# Patient Record
Sex: Female | Born: 1970 | Race: Black or African American | Hispanic: No | Marital: Married | Smoking: Never smoker
Health system: Southern US, Community
[De-identification: ages and names within clinical notes are randomized; demographics above are authoritative.]

## PROBLEM LIST (undated history)

## (undated) DIAGNOSIS — D219 Benign neoplasm of connective and other soft tissue, unspecified: Secondary | ICD-10-CM

## (undated) DIAGNOSIS — D649 Anemia, unspecified: Secondary | ICD-10-CM

---

## 2000-06-04 ENCOUNTER — Other Ambulatory Visit: Admission: RE | Admit: 2000-06-04 | Discharge: 2000-06-04 | Payer: Self-pay | Admitting: Obstetrics and Gynecology

## 2000-07-04 ENCOUNTER — Ambulatory Visit (HOSPITAL_COMMUNITY): Admission: RE | Admit: 2000-07-04 | Discharge: 2000-07-04 | Payer: Self-pay | Admitting: Obstetrics and Gynecology

## 2000-07-04 ENCOUNTER — Encounter: Payer: Self-pay | Admitting: Obstetrics and Gynecology

## 2000-07-12 ENCOUNTER — Ambulatory Visit (HOSPITAL_COMMUNITY): Admission: RE | Admit: 2000-07-12 | Discharge: 2000-07-12 | Payer: Self-pay | Admitting: Obstetrics and Gynecology

## 2000-07-28 ENCOUNTER — Inpatient Hospital Stay (HOSPITAL_COMMUNITY): Admission: AD | Admit: 2000-07-28 | Discharge: 2000-07-28 | Payer: Self-pay | Admitting: Obstetrics and Gynecology

## 2000-12-11 ENCOUNTER — Inpatient Hospital Stay (HOSPITAL_COMMUNITY): Admission: AD | Admit: 2000-12-11 | Discharge: 2000-12-13 | Payer: Self-pay | Admitting: Obstetrics and Gynecology

## 2001-06-26 HISTORY — PX: TUBAL LIGATION: SHX77

## 2001-11-19 ENCOUNTER — Other Ambulatory Visit: Admission: RE | Admit: 2001-11-19 | Discharge: 2001-11-19 | Payer: Self-pay | Admitting: Obstetrics and Gynecology

## 2006-07-17 ENCOUNTER — Ambulatory Visit: Payer: Self-pay

## 2012-01-01 ENCOUNTER — Emergency Department: Payer: Self-pay | Admitting: *Deleted

## 2013-06-26 DIAGNOSIS — B009 Herpesviral infection, unspecified: Secondary | ICD-10-CM | POA: Insufficient documentation

## 2013-09-22 ENCOUNTER — Other Ambulatory Visit: Payer: Self-pay | Admitting: Obstetrics and Gynecology

## 2013-09-22 DIAGNOSIS — D259 Leiomyoma of uterus, unspecified: Secondary | ICD-10-CM

## 2013-10-07 ENCOUNTER — Ambulatory Visit
Admission: RE | Admit: 2013-10-07 | Discharge: 2013-10-07 | Disposition: A | Discharge status: Home / Self Care | Payer: BC Managed Care – PPO | Source: Ambulatory Visit | Attending: Obstetrics and Gynecology | Admitting: Obstetrics and Gynecology

## 2013-10-07 DIAGNOSIS — D259 Leiomyoma of uterus, unspecified: Secondary | ICD-10-CM

## 2013-10-09 ENCOUNTER — Other Ambulatory Visit: Payer: Self-pay | Admitting: Obstetrics and Gynecology

## 2013-10-09 DIAGNOSIS — N920 Excessive and frequent menstruation with regular cycle: Secondary | ICD-10-CM

## 2013-10-09 DIAGNOSIS — D259 Leiomyoma of uterus, unspecified: Secondary | ICD-10-CM

## 2013-10-20 ENCOUNTER — Ambulatory Visit
Admission: RE | Admit: 2013-10-20 | Discharge: 2013-10-20 | Disposition: A | Discharge status: Home / Self Care | Payer: BC Managed Care – PPO | Source: Ambulatory Visit | Attending: Obstetrics and Gynecology | Admitting: Obstetrics and Gynecology

## 2013-10-20 DIAGNOSIS — N920 Excessive and frequent menstruation with regular cycle: Secondary | ICD-10-CM

## 2013-10-20 DIAGNOSIS — D259 Leiomyoma of uterus, unspecified: Secondary | ICD-10-CM

## 2013-10-20 MED ORDER — GADOBENATE DIMEGLUMINE 529 MG/ML IV SOLN
15.0000 mL | Freq: Once | INTRAVENOUS | Status: AC | PRN
  Administered 2013-10-20: 15 mL via INTRAVENOUS

## 2013-10-21 ENCOUNTER — Telehealth: Payer: Self-pay | Admitting: Emergency Medicine

## 2013-10-21 NOTE — Telephone Encounter (Signed)
Called pt to make her aware that Dr Barbie Banner has reviewed her MRI and we are good to proceed with the [place].  We will ck w/ ins. And will have Tiffany at [facility] to contact her to set up a date.  Pt is ready to proceed.

## 2013-11-04 ENCOUNTER — Other Ambulatory Visit (HOSPITAL_COMMUNITY): Payer: BC Managed Care – PPO

## 2013-11-28 ENCOUNTER — Encounter (HOSPITAL_COMMUNITY): Payer: Self-pay | Admitting: Pharmacy Technician

## 2013-11-28 ENCOUNTER — Other Ambulatory Visit: Payer: Self-pay | Admitting: Radiology

## 2013-12-02 ENCOUNTER — Observation Stay (HOSPITAL_COMMUNITY)
Admission: RE | Admit: 2013-12-02 | Discharge: 2013-12-03 | Disposition: A | Discharge status: Home / Self Care | Payer: BC Managed Care – PPO | Source: Ambulatory Visit | Attending: Interventional Radiology | Admitting: Interventional Radiology

## 2013-12-02 ENCOUNTER — Ambulatory Visit (HOSPITAL_COMMUNITY)
Admission: RE | Admit: 2013-12-02 | Discharge: 2013-12-02 | Disposition: A | Discharge status: Home / Self Care | Payer: BC Managed Care – PPO | Source: Ambulatory Visit | Attending: Interventional Radiology | Admitting: Interventional Radiology

## 2013-12-02 ENCOUNTER — Encounter (HOSPITAL_COMMUNITY): Payer: Self-pay

## 2013-12-02 VITALS — BP 131/85 | HR 72 | Temp 97.3°F | Resp 20 | Ht 69.0 in | Wt 176.1 lb

## 2013-12-02 DIAGNOSIS — D219 Benign neoplasm of connective and other soft tissue, unspecified: Secondary | ICD-10-CM | POA: Diagnosis present

## 2013-12-02 DIAGNOSIS — N920 Excessive and frequent menstruation with regular cycle: Secondary | ICD-10-CM | POA: Insufficient documentation

## 2013-12-02 DIAGNOSIS — D259 Leiomyoma of uterus, unspecified: Principal | ICD-10-CM | POA: Insufficient documentation

## 2013-12-02 HISTORY — DX: Anemia, unspecified: D64.9

## 2013-12-02 HISTORY — DX: Benign neoplasm of connective and other soft tissue, unspecified: D21.9

## 2013-12-02 LAB — PROTIME-INR
INR: 0.99 (ref 0.00–1.49)
Prothrombin Time: 12.9 seconds (ref 11.6–15.2)

## 2013-12-02 LAB — CBC WITH DIFFERENTIAL/PLATELET
BASOS PCT: 0 % (ref 0–1)
Basophils Absolute: 0 10*3/uL (ref 0.0–0.1)
EOS ABS: 0.2 10*3/uL (ref 0.0–0.7)
EOS PCT: 4 % (ref 0–5)
HEMATOCRIT: 26.4 % — AB (ref 36.0–46.0)
HEMOGLOBIN: 7.9 g/dL — AB (ref 12.0–15.0)
LYMPHS PCT: 39 % (ref 12–46)
Lymphs Abs: 2.1 10*3/uL (ref 0.7–4.0)
MCH: 22 pg — ABNORMAL LOW (ref 26.0–34.0)
MCHC: 29.9 g/dL — AB (ref 30.0–36.0)
MCV: 73.5 fL — ABNORMAL LOW (ref 78.0–100.0)
Monocytes Absolute: 0.4 10*3/uL (ref 0.1–1.0)
Monocytes Relative: 8 % (ref 3–12)
NEUTROS ABS: 2.7 10*3/uL (ref 1.7–7.7)
Neutrophils Relative %: 49 % (ref 43–77)
Platelets: 362 10*3/uL (ref 150–400)
RBC: 3.59 MIL/uL — AB (ref 3.87–5.11)
RDW: 16.7 % — ABNORMAL HIGH (ref 11.5–15.5)
WBC: 5.4 10*3/uL (ref 4.0–10.5)

## 2013-12-02 LAB — BASIC METABOLIC PANEL
BUN: 9 mg/dL (ref 6–23)
CHLORIDE: 104 meq/L (ref 96–112)
CO2: 26 mEq/L (ref 19–32)
Calcium: 9.3 mg/dL (ref 8.4–10.5)
Creatinine, Ser: 0.84 mg/dL (ref 0.50–1.10)
GFR calc non Af Amer: 85 mL/min — ABNORMAL LOW (ref >90)
GLUCOSE: 90 mg/dL (ref 70–99)
POTASSIUM: 4.1 meq/L (ref 3.7–5.3)
SODIUM: 139 meq/L (ref 137–147)

## 2013-12-02 LAB — HCG, SERUM, QUALITATIVE: PREG SERUM: NEGATIVE

## 2013-12-02 LAB — APTT: aPTT: 27 seconds (ref 24–37)

## 2013-12-02 MED ORDER — NITROGLYCERIN 1 MG/10 ML FOR IR/CATH LAB
100.0000 ug | INTRA_ARTERIAL | Status: AC
  Filled 2013-12-02: qty 10

## 2013-12-02 MED ORDER — ONDANSETRON HCL 4 MG/2ML IJ SOLN
4.0000 mg | Freq: Once | INTRAMUSCULAR | Status: AC
  Administered 2013-12-02: 4 mg via INTRAVENOUS
  Filled 2013-12-02: qty 2

## 2013-12-02 MED ORDER — PROMETHAZINE HCL 25 MG PO TABS: 25.0000 mg | ORAL_TABLET | Freq: Three times a day (TID) | ORAL | Status: DC | PRN

## 2013-12-02 MED ORDER — NITROGLYCERIN 1 MG/10 ML FOR IR/CATH LAB
100.0000 ug | INTRA_ARTERIAL | Status: AC
  Administered 2013-12-02: 100 ug via INTRA_ARTERIAL

## 2013-12-02 MED ORDER — HYDROMORPHONE HCL PF 1 MG/ML IJ SOLN
INTRAMUSCULAR | Status: AC | PRN
  Administered 2013-12-02 (×2): 1 mg via INTRAVENOUS

## 2013-12-02 MED ORDER — MIDAZOLAM HCL 2 MG/2ML IJ SOLN
INTRAMUSCULAR | Status: AC
  Filled 2013-12-02: qty 6

## 2013-12-02 MED ORDER — KETOROLAC TROMETHAMINE 30 MG/ML IJ SOLN
30.0000 mg | INTRAMUSCULAR | Status: AC
  Administered 2013-12-02: 30 mg via INTRAVENOUS
  Filled 2013-12-02: qty 1

## 2013-12-02 MED ORDER — MIDAZOLAM HCL 2 MG/2ML IJ SOLN
INTRAMUSCULAR | Status: AC | PRN
  Administered 2013-12-02: 2 mg via INTRAVENOUS
  Administered 2013-12-02 (×3): 1 mg via INTRAVENOUS

## 2013-12-02 MED ORDER — SODIUM CHLORIDE 0.9 % IJ SOLN
3.0000 mL | Freq: Two times a day (BID) | INTRAMUSCULAR | Status: DC
  Administered 2013-12-02: 3 mL via INTRAVENOUS

## 2013-12-02 MED ORDER — DOCUSATE SODIUM 100 MG PO CAPS
100.0000 mg | ORAL_CAPSULE | Freq: Two times a day (BID) | ORAL | Status: DC
  Administered 2013-12-02 – 2013-12-03 (×3): 100 mg via ORAL
  Filled 2013-12-02 (×4): qty 1

## 2013-12-02 MED ORDER — SODIUM CHLORIDE 0.9 % IV SOLN
INTRAVENOUS | Status: DC
  Administered 2013-12-02: 08:00:00 via INTRAVENOUS

## 2013-12-02 MED ORDER — SODIUM CHLORIDE 0.9 % IJ SOLN: 3.0000 mL | INTRAMUSCULAR | Status: DC | PRN

## 2013-12-02 MED ORDER — SODIUM CHLORIDE 0.9 % IJ SOLN: 9.0000 mL | INTRAMUSCULAR | Status: DC | PRN

## 2013-12-02 MED ORDER — DIPHENHYDRAMINE HCL 50 MG/ML IJ SOLN: 12.5000 mg | Freq: Four times a day (QID) | INTRAMUSCULAR | Status: DC | PRN

## 2013-12-02 MED ORDER — ONDANSETRON HCL 4 MG/2ML IJ SOLN: 4.0000 mg | Freq: Four times a day (QID) | INTRAMUSCULAR | Status: DC | PRN

## 2013-12-02 MED ORDER — HYDROMORPHONE 0.3 MG/ML IV SOLN
INTRAVENOUS | Status: DC
  Administered 2013-12-02: 0.6 mg via INTRAVENOUS
  Administered 2013-12-02: 12:00:00 via INTRAVENOUS
  Administered 2013-12-02: 2.4 mg via INTRAVENOUS
  Administered 2013-12-02: 2.78 mg via INTRAVENOUS
  Administered 2013-12-03: 04:00:00 via INTRAVENOUS
  Administered 2013-12-03: 1.41 mg via INTRAVENOUS
  Administered 2013-12-03: 1.9 mg via INTRAVENOUS
  Filled 2013-12-02: qty 25

## 2013-12-02 MED ORDER — ONDANSETRON HCL 4 MG/2ML IJ SOLN
4.0000 mg | Freq: Four times a day (QID) | INTRAMUSCULAR | Status: DC | PRN
  Administered 2013-12-03: 4 mg via INTRAVENOUS
  Filled 2013-12-02: qty 2

## 2013-12-02 MED ORDER — HYDROCODONE-ACETAMINOPHEN 5-325 MG PO TABS: 1.0000 | ORAL_TABLET | ORAL | Status: DC | PRN

## 2013-12-02 MED ORDER — DIPHENHYDRAMINE HCL 12.5 MG/5ML PO ELIX
12.5000 mg | ORAL_SOLUTION | Freq: Four times a day (QID) | ORAL | Status: DC | PRN
  Filled 2013-12-02: qty 5

## 2013-12-02 MED ORDER — PROMETHAZINE HCL 25 MG RE SUPP: 25.0000 mg | Freq: Three times a day (TID) | RECTAL | Status: DC | PRN

## 2013-12-02 MED ORDER — HYDROMORPHONE HCL PF 2 MG/ML IJ SOLN
INTRAMUSCULAR | Status: AC
  Filled 2013-12-02: qty 2

## 2013-12-02 MED ORDER — IBUPROFEN 800 MG PO TABS
800.0000 mg | ORAL_TABLET | Freq: Four times a day (QID) | ORAL | Status: DC
  Administered 2013-12-02 – 2013-12-03 (×4): 800 mg via ORAL
  Filled 2013-12-02 (×7): qty 1

## 2013-12-02 MED ORDER — NALOXONE HCL 0.4 MG/ML IJ SOLN: 0.4000 mg | INTRAMUSCULAR | Status: DC | PRN

## 2013-12-02 MED ORDER — LIDOCAINE HCL 1 % IJ SOLN
INTRAMUSCULAR | Status: AC
  Filled 2013-12-02: qty 20

## 2013-12-02 MED ORDER — CEFAZOLIN SODIUM-DEXTROSE 2-3 GM-% IV SOLR
2.0000 g | Freq: Once | INTRAVENOUS | Status: AC
  Administered 2013-12-02: 2 g via INTRAVENOUS
  Filled 2013-12-02: qty 50

## 2013-12-02 MED ORDER — SODIUM CHLORIDE 0.9 % IV SOLN: 250.0000 mL | INTRAVENOUS | Status: DC | PRN

## 2013-12-02 MED ORDER — HYDROMORPHONE 0.3 MG/ML IV SOLN
INTRAVENOUS | Status: AC
  Filled 2013-12-02: qty 25

## 2013-12-02 NOTE — Procedures (Signed)
B [place] No comp

## 2013-12-02 NOTE — Progress Notes (Signed)
Subjective: Pt seen post procedure. Resting in bed, sleepy from pain meds, but arouseable and answers questions appropriately. Report pain 'not too bad' No N/V  Objective: Physical Exam: BP 109/71  Pulse 70  Temp(Src) 97.8 F (36.6 C) (Oral)  Resp 9  Ht 5\' 9"  (1.753 m)  Wt 176 lb 2.4 oz (79.9 kg)  BMI 26.00 kg/m2  SpO2 98%  LMP 11/15/2013 Abd soft, ND, mildly tender (R)groin soft, NT, no hematoma    Labs: CBC  Recent Labs  12/02/13 0734  WBC 5.4  HGB 7.9*  HCT 26.4*  PLT 362   BMET  Recent Labs  12/02/13 0734  NA 139  K 4.1  CL 104  CO2 26  GLUCOSE 90  BUN 9  CREATININE 0.84  CALCIUM 9.3   LFT No results found for this basename: PROT, ALBUMIN, AST, ALT, ALKPHOS, BILITOT, BILIDIR, IBILI, LIPASE,  in the last 72 hours PT/INR  Recent Labs  12/02/13 0734  LABPROT 12.9  INR 0.99     Studies/Results: Ir Angiogram Pelvis Selective Or Supraselective  12/02/2013   CLINICAL DATA:  Uterine fibroids.  Menorrhagia.  EXAM: UTERINE ARTERY EMBOLIZATION  FLUOROSCOPY TIME:  34 min and 36 seconds.  MEDICATIONS AND MEDICAL HISTORY: Versed 5 mg, Fentanyl 300 mcg.  As antibiotic prophylaxis, Ancef was ordered pre-procedure and administered intravenously within one hour of incision.  Allergies: None  ANESTHESIA/SEDATION: Moderate sedation time: 1 hr and 50 min.  CONTRAST:  108 cc Omnipaque 300.  PROCEDURE: Vessels selected:  Third order bilateral iliac artery.  The procedure, risks, benefits, and alternatives were explained to the patient. Questions regarding the procedure were encouraged and answered. The patient understands and consents to the procedure.  The right groin was prepped with Betadine in a sterile fashion, and a sterile drape was applied covering the operative field. A sterile gown and sterile gloves were used for the procedure.  A micropuncture needle was inserted into the right common femoral artery, under sonographic guidance, followed by insertion of a 018 wire  which was exchanged for a [employer]. A 5-French sheath was inserted. Cobra II catheter was advanced into the aorta. The contralateral left common iliac artery was selected. The internal iliac artery on the left was selected. 3D angiography was performed. A micro catheter was advanced over an 018 glide wire into the left uterine artery. Angiography was performed.  Embolization was performed utilizingtwo vials 500-700 micron embospheres and 3.5 vials 700-900 micron embospheres.  The micro catheter was removed and flushed. A Waltman's loop was created. The ipsilateral right common iliac artery and right internal iliac artery were selected. Angiography was performed. A micro catheter was advanced over a 0018 glide wire into the right uterine artery. Angiography was performed.  Embolization was again performed with2 vials 500-700 micron embospheres and half of bile of the 709 100 micron embosphere.  The micro catheter and Cobra catheter were removed. Right femoral angiography was performed. An Exoseal device was deployed without complication and hemostasis was achieved.  FINDINGS: Imaging demonstrates bilateral pelvic anatomy and bilateral uterine artery angiography. Expected anatomy is identified. No blood supply from the uterine artery to the vagina is visualized.  Post embolization angiography demonstrates relative stasis of flow and pruning of the vasculature to the uterus.  COMPLICATIONS: None  IMPRESSION: Successful bilateral uterine artery embolization.   Electronically Signed   By: Maryclare Bean M.D.   On: 12/02/2013 12:56   Ir Angiogram Pelvis Selective Or Supraselective  12/02/2013   CLINICAL DATA:  Uterine  fibroids.  Menorrhagia.  EXAM: UTERINE ARTERY EMBOLIZATION  FLUOROSCOPY TIME:  34 min and 36 seconds.  MEDICATIONS AND MEDICAL HISTORY: Versed 5 mg, Fentanyl 300 mcg.  As antibiotic prophylaxis, Ancef was ordered pre-procedure and administered intravenously within one hour of incision.  Allergies: None   ANESTHESIA/SEDATION: Moderate sedation time: 1 hr and 50 min.  CONTRAST:  108 cc Omnipaque 300.  PROCEDURE: Vessels selected:  Third order bilateral iliac artery.  The procedure, risks, benefits, and alternatives were explained to the patient. Questions regarding the procedure were encouraged and answered. The patient understands and consents to the procedure.  The right groin was prepped with Betadine in a sterile fashion, and a sterile drape was applied covering the operative field. A sterile gown and sterile gloves were used for the procedure.  A micropuncture needle was inserted into the right common femoral artery, under sonographic guidance, followed by insertion of a 018 wire which was exchanged for a [employer]. A 5-French sheath was inserted. Cobra II catheter was advanced into the aorta. The contralateral left common iliac artery was selected. The internal iliac artery on the left was selected. 3D angiography was performed. A micro catheter was advanced over an 018 glide wire into the left uterine artery. Angiography was performed.  Embolization was performed utilizingtwo vials 500-700 micron embospheres and 3.5 vials 700-900 micron embospheres.  The micro catheter was removed and flushed. A Waltman's loop was created. The ipsilateral right common iliac artery and right internal iliac artery were selected. Angiography was performed. A micro catheter was advanced over a 0018 glide wire into the right uterine artery. Angiography was performed.  Embolization was again performed with2 vials 500-700 micron embospheres and half of bile of the 709 100 micron embosphere.  The micro catheter and Cobra catheter were removed. Right femoral angiography was performed. An Exoseal device was deployed without complication and hemostasis was achieved.  FINDINGS: Imaging demonstrates bilateral pelvic anatomy and bilateral uterine artery angiography. Expected anatomy is identified. No blood supply from the uterine artery to the  vagina is visualized.  Post embolization angiography demonstrates relative stasis of flow and pruning of the vasculature to the uterus.  COMPLICATIONS: None  IMPRESSION: Successful bilateral uterine artery embolization.   Electronically Signed   By: Maryclare Bean M.D.   On: 12/02/2013 12:56   Ir Angiogram Selective Each Additional Vessel  12/02/2013   CLINICAL DATA:  Uterine fibroids.  Menorrhagia.  EXAM: UTERINE ARTERY EMBOLIZATION  FLUOROSCOPY TIME:  34 min and 36 seconds.  MEDICATIONS AND MEDICAL HISTORY: Versed 5 mg, Fentanyl 300 mcg.  As antibiotic prophylaxis, Ancef was ordered pre-procedure and administered intravenously within one hour of incision.  Allergies: None  ANESTHESIA/SEDATION: Moderate sedation time: 1 hr and 50 min.  CONTRAST:  108 cc Omnipaque 300.  PROCEDURE: Vessels selected:  Third order bilateral iliac artery.  The procedure, risks, benefits, and alternatives were explained to the patient. Questions regarding the procedure were encouraged and answered. The patient understands and consents to the procedure.  The right groin was prepped with Betadine in a sterile fashion, and a sterile drape was applied covering the operative field. A sterile gown and sterile gloves were used for the procedure.  A micropuncture needle was inserted into the right common femoral artery, under sonographic guidance, followed by insertion of a 018 wire which was exchanged for a [employer]. A 5-French sheath was inserted. Cobra II catheter was advanced into the aorta. The contralateral left common iliac artery was selected. The internal iliac artery on  the left was selected. 3D angiography was performed. A micro catheter was advanced over an 018 glide wire into the left uterine artery. Angiography was performed.  Embolization was performed utilizingtwo vials 500-700 micron embospheres and 3.5 vials 700-900 micron embospheres.  The micro catheter was removed and flushed. A Waltman's loop was created. The ipsilateral right  common iliac artery and right internal iliac artery were selected. Angiography was performed. A micro catheter was advanced over a 0018 glide wire into the right uterine artery. Angiography was performed.  Embolization was again performed with2 vials 500-700 micron embospheres and half of bile of the 709 100 micron embosphere.  The micro catheter and Cobra catheter were removed. Right femoral angiography was performed. An Exoseal device was deployed without complication and hemostasis was achieved.  FINDINGS: Imaging demonstrates bilateral pelvic anatomy and bilateral uterine artery angiography. Expected anatomy is identified. No blood supply from the uterine artery to the vagina is visualized.  Post embolization angiography demonstrates relative stasis of flow and pruning of the vasculature to the uterus.  COMPLICATIONS: None  IMPRESSION: Successful bilateral uterine artery embolization.   Electronically Signed   By: Maryclare Bean M.D.   On: 12/02/2013 12:56   Ir Angiogram Selective Each Additional Vessel  12/02/2013   CLINICAL DATA:  Uterine fibroids.  Menorrhagia.  EXAM: UTERINE ARTERY EMBOLIZATION  FLUOROSCOPY TIME:  34 min and 36 seconds.  MEDICATIONS AND MEDICAL HISTORY: Versed 5 mg, Fentanyl 300 mcg.  As antibiotic prophylaxis, Ancef was ordered pre-procedure and administered intravenously within one hour of incision.  Allergies: None  ANESTHESIA/SEDATION: Moderate sedation time: 1 hr and 50 min.  CONTRAST:  108 cc Omnipaque 300.  PROCEDURE: Vessels selected:  Third order bilateral iliac artery.  The procedure, risks, benefits, and alternatives were explained to the patient. Questions regarding the procedure were encouraged and answered. The patient understands and consents to the procedure.  The right groin was prepped with Betadine in a sterile fashion, and a sterile drape was applied covering the operative field. A sterile gown and sterile gloves were used for the procedure.  A micropuncture needle was  inserted into the right common femoral artery, under sonographic guidance, followed by insertion of a 018 wire which was exchanged for a [employer]. A 5-French sheath was inserted. Cobra II catheter was advanced into the aorta. The contralateral left common iliac artery was selected. The internal iliac artery on the left was selected. 3D angiography was performed. A micro catheter was advanced over an 018 glide wire into the left uterine artery. Angiography was performed.  Embolization was performed utilizingtwo vials 500-700 micron embospheres and 3.5 vials 700-900 micron embospheres.  The micro catheter was removed and flushed. A Waltman's loop was created. The ipsilateral right common iliac artery and right internal iliac artery were selected. Angiography was performed. A micro catheter was advanced over a 0018 glide wire into the right uterine artery. Angiography was performed.  Embolization was again performed with2 vials 500-700 micron embospheres and half of bile of the 709 100 micron embosphere.  The micro catheter and Cobra catheter were removed. Right femoral angiography was performed. An Exoseal device was deployed without complication and hemostasis was achieved.  FINDINGS: Imaging demonstrates bilateral pelvic anatomy and bilateral uterine artery angiography. Expected anatomy is identified. No blood supply from the uterine artery to the vagina is visualized.  Post embolization angiography demonstrates relative stasis of flow and pruning of the vasculature to the uterus.  COMPLICATIONS: None  IMPRESSION: Successful bilateral uterine artery embolization.  Electronically Signed   By: Maryclare Bean M.D.   On: 12/02/2013 12:56   Ir Transcath/emboliz  12/02/2013   CLINICAL DATA:  Uterine fibroids.  Menorrhagia.  EXAM: UTERINE ARTERY EMBOLIZATION  FLUOROSCOPY TIME:  34 min and 36 seconds.  MEDICATIONS AND MEDICAL HISTORY: Versed 5 mg, Fentanyl 300 mcg.  As antibiotic prophylaxis, Ancef was ordered pre-procedure and  administered intravenously within one hour of incision.  Allergies: None  ANESTHESIA/SEDATION: Moderate sedation time: 1 hr and 50 min.  CONTRAST:  108 cc Omnipaque 300.  PROCEDURE: Vessels selected:  Third order bilateral iliac artery.  The procedure, risks, benefits, and alternatives were explained to the patient. Questions regarding the procedure were encouraged and answered. The patient understands and consents to the procedure.  The right groin was prepped with Betadine in a sterile fashion, and a sterile drape was applied covering the operative field. A sterile gown and sterile gloves were used for the procedure.  A micropuncture needle was inserted into the right common femoral artery, under sonographic guidance, followed by insertion of a 018 wire which was exchanged for a [employer]. A 5-French sheath was inserted. Cobra II catheter was advanced into the aorta. The contralateral left common iliac artery was selected. The internal iliac artery on the left was selected. 3D angiography was performed. A micro catheter was advanced over an 018 glide wire into the left uterine artery. Angiography was performed.  Embolization was performed utilizingtwo vials 500-700 micron embospheres and 3.5 vials 700-900 micron embospheres.  The micro catheter was removed and flushed. A Waltman's loop was created. The ipsilateral right common iliac artery and right internal iliac artery were selected. Angiography was performed. A micro catheter was advanced over a 0018 glide wire into the right uterine artery. Angiography was performed.  Embolization was again performed with2 vials 500-700 micron embospheres and half of bile of the 709 100 micron embosphere.  The micro catheter and Cobra catheter were removed. Right femoral angiography was performed. An Exoseal device was deployed without complication and hemostasis was achieved.  FINDINGS: Imaging demonstrates bilateral pelvic anatomy and bilateral uterine artery angiography.  Expected anatomy is identified. No blood supply from the uterine artery to the vagina is visualized.  Post embolization angiography demonstrates relative stasis of flow and pruning of the vasculature to the uterus.  COMPLICATIONS: None  IMPRESSION: Successful bilateral uterine artery embolization.   Electronically Signed   By: Maryclare Bean M.D.   On: 12/02/2013 12:56   Ir 3d Primitivo Gauze Darreld Mclean  12/02/2013   CLINICAL DATA:  Uterine fibroids.  Menorrhagia.  EXAM: UTERINE ARTERY EMBOLIZATION  FLUOROSCOPY TIME:  34 min and 36 seconds.  MEDICATIONS AND MEDICAL HISTORY: Versed 5 mg, Fentanyl 300 mcg.  As antibiotic prophylaxis, Ancef was ordered pre-procedure and administered intravenously within one hour of incision.  Allergies: None  ANESTHESIA/SEDATION: Moderate sedation time: 1 hr and 50 min.  CONTRAST:  108 cc Omnipaque 300.  PROCEDURE: Vessels selected:  Third order bilateral iliac artery.  The procedure, risks, benefits, and alternatives were explained to the patient. Questions regarding the procedure were encouraged and answered. The patient understands and consents to the procedure.  The right groin was prepped with Betadine in a sterile fashion, and a sterile drape was applied covering the operative field. A sterile gown and sterile gloves were used for the procedure.  A micropuncture needle was inserted into the right common femoral artery, under sonographic guidance, followed by insertion of a 018 wire which was exchanged for a [employer]. A 5-French  sheath was inserted. Cobra II catheter was advanced into the aorta. The contralateral left common iliac artery was selected. The internal iliac artery on the left was selected. 3D angiography was performed. A micro catheter was advanced over an 018 glide wire into the left uterine artery. Angiography was performed.  Embolization was performed utilizingtwo vials 500-700 micron embospheres and 3.5 vials 700-900 micron embospheres.  The micro catheter was removed and  flushed. A Waltman's loop was created. The ipsilateral right common iliac artery and right internal iliac artery were selected. Angiography was performed. A micro catheter was advanced over a 0018 glide wire into the right uterine artery. Angiography was performed.  Embolization was again performed with2 vials 500-700 micron embospheres and half of bile of the 709 100 micron embosphere.  The micro catheter and Cobra catheter were removed. Right femoral angiography was performed. An Exoseal device was deployed without complication and hemostasis was achieved.  FINDINGS: Imaging demonstrates bilateral pelvic anatomy and bilateral uterine artery angiography. Expected anatomy is identified. No blood supply from the uterine artery to the vagina is visualized.  Post embolization angiography demonstrates relative stasis of flow and pruning of the vasculature to the uterus.  COMPLICATIONS: None  IMPRESSION: Successful bilateral uterine artery embolization.   Electronically Signed   By: Maryclare Bean M.D.   On: 12/02/2013 12:56   Ir US Guide Vasc Access Right  12/02/2013   CLINICAL DATA:  Uterine fibroids.  Menorrhagia.  EXAM: UTERINE ARTERY EMBOLIZATION  FLUOROSCOPY TIME:  34 min and 36 seconds.  MEDICATIONS AND MEDICAL HISTORY: Versed 5 mg, Fentanyl 300 mcg.  As antibiotic prophylaxis, Ancef was ordered pre-procedure and administered intravenously within one hour of incision.  Allergies: None  ANESTHESIA/SEDATION: Moderate sedation time: 1 hr and 50 min.  CONTRAST:  108 cc Omnipaque 300.  PROCEDURE: Vessels selected:  Third order bilateral iliac artery.  The procedure, risks, benefits, and alternatives were explained to the patient. Questions regarding the procedure were encouraged and answered. The patient understands and consents to the procedure.  The right groin was prepped with Betadine in a sterile fashion, and a sterile drape was applied covering the operative field. A sterile gown and sterile gloves were used for the  procedure.  A micropuncture needle was inserted into the right common femoral artery, under sonographic guidance, followed by insertion of a 018 wire which was exchanged for a [employer]. A 5-French sheath was inserted. Cobra II catheter was advanced into the aorta. The contralateral left common iliac artery was selected. The internal iliac artery on the left was selected. 3D angiography was performed. A micro catheter was advanced over an 018 glide wire into the left uterine artery. Angiography was performed.  Embolization was performed utilizingtwo vials 500-700 micron embospheres and 3.5 vials 700-900 micron embospheres.  The micro catheter was removed and flushed. A Waltman's loop was created. The ipsilateral right common iliac artery and right internal iliac artery were selected. Angiography was performed. A micro catheter was advanced over a 0018 glide wire into the right uterine artery. Angiography was performed.  Embolization was again performed with2 vials 500-700 micron embospheres and half of bile of the 709 100 micron embosphere.  The micro catheter and Cobra catheter were removed. Right femoral angiography was performed. An Exoseal device was deployed without complication and hemostasis was achieved.  FINDINGS: Imaging demonstrates bilateral pelvic anatomy and bilateral uterine artery angiography. Expected anatomy is identified. No blood supply from the uterine artery to the vagina is visualized.  Post embolization angiography  demonstrates relative stasis of flow and pruning of the vasculature to the uterus.  COMPLICATIONS: None  IMPRESSION: Successful bilateral uterine artery embolization.   Electronically Signed   By: Maryclare Bean M.D.   On: 12/02/2013 12:56    Assessment/Plan: S/p [place] DC foley and increase OOB tonight Plan for DC tomorrow if doing well    LOS: 0 days    Ascencion Dike PA-C 12/02/2013 4:39 PM

## 2013-12-02 NOTE — H&P (Signed)
Chief Complaint: "I'm here for my fibroids treatment" Referring Physician:Haygood HPI: Cynthia Turner is an 43 y.o. female with symptomatic uterine fibroids.  She was seen in consult by Dr. Barbie Banner and is scheduled for uterine artery embolization. She has been feeling ok. She takes OTC iron supplement twice daily. Denies fevers, chills, N/V, CP, dysuria  Past Medical History:  Past Medical History  Diagnosis Date  . Anemia     from fibroids  . Fibroids     Past Surgical History:  Past Surgical History  Procedure Laterality Date  . Tubal ligation  2003    Family History: History reviewed. No pertinent family history.  Social History:  reports that she has never smoked. She has never used smokeless tobacco. She reports that she does not drink alcohol or use illicit drugs.  Allergies: No Known Allergies  Medications:   Medication List    ASK your doctor about these medications       BIOTIN 5000 5 MG Caps  Generic drug:  Biotin  Take 5 mg by mouth daily.     ferrous fumarate 325 (106 FE) MG Tabs tablet  Commonly known as:  HEMOCYTE - 106 mg FE  Take 1 tablet by mouth daily.     multivitamin with minerals Tabs tablet  Take 1 tablet by mouth daily.        Please HPI for pertinent positives, otherwise complete 10 system ROS negative.  Physical Exam: BP 126/69  Pulse 74  Temp(Src) 98.1 F (36.7 C) (Oral)  Resp 16  Ht $R'5\' 9"'bX$  (1.753 m)  Wt 175 lb (79.379 kg)  BMI 25.83 kg/m2  SpO2 100%  LMP 11/15/2013 Body mass index is 25.83 kg/(m^2).   General Appearance:  Alert, cooperative, no distress, appears stated age  Head:  Normocephalic, without obvious abnormality, atraumatic  ENT: Unremarkable  Neck: Supple, symmetrical, trachea midline  Lungs:   Clear to auscultation bilaterally, no w/r/r, respirations unlabored without use of accessory muscles.  Heart:  Regular rate and rhythm, S1, S2 normal, no murmur, rub or gallop.  Abdomen:   Soft, non-tender, non distended.   Neurologic: Normal affect, no gross deficits.   Results for orders placed during the hospital encounter of 12/02/13 (from the past 48 hour(s))  APTT     Status: None   Collection Time    12/02/13  7:34 AM      Result Value Ref Range   aPTT 27  24 - 37 seconds  BASIC METABOLIC PANEL     Status: Abnormal   Collection Time    12/02/13  7:34 AM      Result Value Ref Range   Sodium 139  137 - 147 mEq/L   Potassium 4.1  3.7 - 5.3 mEq/L   Chloride 104  96 - 112 mEq/L   CO2 26  19 - 32 mEq/L   Glucose, Bld 90  70 - 99 mg/dL   BUN 9  6 - 23 mg/dL   Creatinine, Ser 0.84  0.50 - 1.10 mg/dL   Calcium 9.3  8.4 - 10.5 mg/dL   GFR calc non Af Amer 85 (*) >90 mL/min   GFR calc Af Amer >90  >90 mL/min   Comment: (NOTE)     The eGFR has been calculated using the CKD EPI equation.     This calculation has not been validated in all clinical situations.     eGFR's persistently <90 mL/min signify possible Chronic Kidney     Disease.  CBC WITH  DIFFERENTIAL     Status: Abnormal   Collection Time    12/02/13  7:34 AM      Result Value Ref Range   WBC 5.4  4.0 - 10.5 K/uL   RBC 3.59 (*) 3.87 - 5.11 MIL/uL   Hemoglobin 7.9 (*) 12.0 - 15.0 g/dL   HCT 26.4 (*) 36.0 - 46.0 %   MCV 73.5 (*) 78.0 - 100.0 fL   MCH 22.0 (*) 26.0 - 34.0 pg   MCHC 29.9 (*) 30.0 - 36.0 g/dL   RDW 16.7 (*) 11.5 - 15.5 %   Platelets 362  150 - 400 K/uL   Neutrophils Relative % 49  43 - 77 %   Lymphocytes Relative 39  12 - 46 %   Monocytes Relative 8  3 - 12 %   Eosinophils Relative 4  0 - 5 %   Basophils Relative 0  0 - 1 %   Neutro Abs 2.7  1.7 - 7.7 K/uL   Lymphs Abs 2.1  0.7 - 4.0 K/uL   Monocytes Absolute 0.4  0.1 - 1.0 K/uL   Eosinophils Absolute 0.2  0.0 - 0.7 K/uL   Basophils Absolute 0.0  0.0 - 0.1 K/uL   RBC Morphology TARGET CELLS    HCG, SERUM, QUALITATIVE     Status: None   Collection Time    12/02/13  7:34 AM      Result Value Ref Range   Preg, Serum NEGATIVE  NEGATIVE   Comment:            THE  SENSITIVITY OF THIS     METHODOLOGY IS >10 mIU/mL.  PROTIME-INR     Status: None   Collection Time    12/02/13  7:34 AM      Result Value Ref Range   Prothrombin Time 12.9  11.6 - 15.2 seconds   INR 0.99  0.00 - 1.49   No results found.  Assessment/Plan Symptomatic uterine fibroids For [place] procedure today. Discussed procedure at length with pt, husband, and mother. Explained risks, complications, use of sedation. Discussed expected post procedure course, pain control, and restrictions. Labs reviewed. Consent signed in chart  Ascencion Dike PA-C 12/02/2013, 8:43 AM

## 2013-12-02 NOTE — Sedation Documentation (Signed)
Fluid bolus given in preparation of intra procedural Nitro effect.

## 2013-12-03 ENCOUNTER — Other Ambulatory Visit: Payer: Self-pay | Admitting: Radiology

## 2013-12-03 DIAGNOSIS — D219 Benign neoplasm of connective and other soft tissue, unspecified: Secondary | ICD-10-CM

## 2013-12-03 MED ORDER — PROMETHAZINE HCL 12.5 MG PO TABS: 12.5000 mg | ORAL_TABLET | Freq: Four times a day (QID) | ORAL | Status: DC | PRN

## 2013-12-03 MED ORDER — DSS 100 MG PO CAPS: 100.0000 mg | ORAL_CAPSULE | Freq: Two times a day (BID) | ORAL | Status: AC

## 2013-12-03 MED ORDER — HYDROCODONE-ACETAMINOPHEN 5-325 MG PO TABS: 1.0000 | ORAL_TABLET | ORAL | Status: DC | PRN

## 2013-12-03 MED ORDER — IBUPROFEN 800 MG PO TABS: 800.0000 mg | ORAL_TABLET | Freq: Three times a day (TID) | ORAL | Status: DC

## 2013-12-03 NOTE — Discharge Summary (Signed)
Physician Discharge Summary  Patient ID: Cynthia Turner MRN: [id number] DOB/AGE: 1970-10-26 43 y.o.  Admit date: 12/02/2013 Discharge date: 12/03/2013  Admission Diagnoses: Principal Problem:   Fibroids  Discharge Diagnoses:  Principal Problem:   Fibroids    Procedures: Uterine artery embolization 6/9  Discharged Condition: good  Hospital Course: HPI: Cynthia Turner is an 43 y.o. female with symptomatic uterine fibroids. She was seen in consult by Dr. Barbie Banner and is scheduled for uterine artery embolization.  Summary: She was brought to the IR suite and underwent successful embolization procedure. She was admitted to the floor in stable condition. Pain and nausea regimen initiated. Foley was removed after her bedrest time and she has voided without difficulty. She has tolerated some regular diet and her pain is controlled with oral agents. She is determined to be stable for discharge. Medications, instructions, and follow up information reviewed with pt and husband.   Consults: None   Discharge Exam: Blood pressure 131/85, pulse 72, temperature 97.3 F (36.3 C), temperature source Oral, resp. rate 20, height 5\' 9"  (1.753 m), weight 176 lb 2.4 oz (79.9 kg), last menstrual period 11/15/2013, SpO2 98.00%. Lungs: CTA without w/r/r Heart: Regular Abdomen: soft, ND, NT Ext: (R)groin site clean, soft, NT, no hematoma    Disposition:   Discharge Instructions   Call MD for:  difficulty breathing, headache or visual disturbances    Complete by:  As directed      Call MD for:  persistant dizziness or light-headedness    Complete by:  As directed      Call MD for:  persistant nausea and vomiting    Complete by:  As directed      Call MD for:  redness, tenderness, or signs of infection (pain, swelling, redness, odor or green/yellow discharge around incision site)    Complete by:  As directed      Call MD for:  severe uncontrolled pain    Complete by:  As directed      Diet  general    Complete by:  As directed      Increase activity slowly    Complete by:  As directed      Lifting restrictions    Complete by:  As directed   No heavy lifting, pushing, pulling for 2 weeks.     May shower / Bathe    Complete by:  As directed      No dressing needed    Complete by:  As directed             Medication List         BIOTIN 5000 5 MG Caps  Generic drug:  Biotin  Take 5 mg by mouth daily.     DSS 100 MG Caps  Take 100 mg by mouth 2 (two) times daily.     ferrous fumarate 325 (106 FE) MG Tabs tablet  Commonly known as:  HEMOCYTE - 106 mg FE  Take 1 tablet by mouth daily.     HYDROcodone-acetaminophen 5-325 MG per tablet  Commonly known as:  NORCO/VICODIN  Take 1-2 tablets by mouth every 4 (four) hours as needed for moderate pain or severe pain.     ibuprofen 800 MG tablet  Commonly known as:  ADVIL,MOTRIN  Take 1 tablet (800 mg total) by mouth 3 (three) times daily.     multivitamin with minerals Tabs tablet  Take 1 tablet by mouth daily.     promethazine 12.5 MG tablet  Commonly  known as:  PHENERGAN  Take 1 tablet (12.5 mg total) by mouth every 6 (six) hours as needed for nausea.           Follow-up Information   Follow up with HOSS,ART A, MD In 2 weeks. (Tammy from office will call you with appt date/time. You may call [phone] with questions or concerns)    Specialty:  Interventional Radiology   Contact information:   [phone]       Signed: Ascencion Dike PA-C 12/03/2013, 12:20 PM

## 2013-12-03 NOTE — Progress Notes (Signed)
POD #1 s/p [place]  Feeling a little better Some nausea, hasn;t eaten much yet. Still using PCA but not c/o much pain  BP 121/73  Pulse 83  Temp(Src) 97.8 F (36.6 C) (Oral)  Resp 18  Ht 5\' 9"  (1.753 m)  Wt 176 lb 2.4 oz (79.9 kg)  BMI 26.00 kg/m2  SpO2 100%  LMP 11/15/2013 Abd: soft, NT Groin soft, NT dressing dry  S/p [place] DC PCA, increase OOB Plan for DC later this morning  Ascencion Dike PA-C Interventional Radiology 12/03/2013 8:30 AM

## 2013-12-03 NOTE — Progress Notes (Signed)
Discharge instructions given to pt, verbalized understanding. Left the unit in stable condition. 

## 2013-12-03 NOTE — Discharge Instructions (Signed)
Uterine Artery Embolization for Fibroids Uterine artery embolization is a nonsurgical treatment to shrink fibroids. A thin plastic tube (catheter) is used to inject material that blocks off the blood supply to the fibroid, which causes the fibroid to shrink. LET [facility] CARE PROVIDER KNOW ABOUT:  Any allergies you have.  All medicines you are taking, including vitamins, herbs, eye drops, creams, and over-the-counter medicines.  Previous problems you or members of your family have had with the use of anesthetics.  Any blood disorders you have.  Previous surgeries you have had.  Medical conditions you have. RISKS AND COMPLICATIONS  Injury to the uterus from decreased blood supply  Infection.  Blood infection (septicemia).  Lack of menstrual periods (amenorrhea).  Death of tissue cells (necrosis) around your bladder or vulva.  Development of a hole between organs or from an organ to the surface of your skin (fistula).  Blood clot in the legs (deep vein thrombosis) or lung (pulmonary embolus). BEFORE THE PROCEDURE  Ask your health care provider about changing or stopping your regular medicines.   Do not take aspirin or blood thinners (anticoagulants) for 1 week before the surgery or as directed by your health care provider.  Do not eat or drink anything for 8 hours before the surgery or as directed by your health care provider.   Empty your bladder before the procedure begins. PROCEDURE   An IV tube will be placed into one of your veins. This will be used to give you a sedative and pain medication (conscious sedation).  You will be given a medicine that numbs the area (local anesthetic).  A small cut will be made in your groin. A catheter is then inserted into the main artery of your leg.  The catheter will be guided through the artery to your uterus. A series of images will be taken while dye is injected through the catheter in your groin. X-rays are taken at the  same time. This is done to provide a road map of the blood supply to your uterus and fibroids.  Tiny plastic spheres, about the size of sand grains, will be injected through the catheter. Metal coils may be used to help block the artery. The particles will lodge in tiny branches of the uterine artery that supplies blood to the fibroids.  The procedure is repeated on the artery that supplies the other side of the uterus.  The catheter is then removed and pressure is held to stop any bleeding. No stitches are needed.  A dressing is then placed over the cut (incision). AFTER THE PROCEDURE  You will be taken to a recovery area where your progress will be monitored until you are awake, stable, and taking fluids well. If there are no other problems, you will then be moved to a regular hospital room.  You will be observed overnight in the hospital.  You will have cramping that should be controlled with pain medication.   Uterine Artery Embolization for Fibroids, Care After Refer to this sheet in the next few weeks. These instructions provide you with information on caring for yourself after your procedure. Your health care provider may also give you more specific instructions. Your treatment has been planned according to current medical practices, but problems sometimes occur. Call your health care provider if you have any problems or questions after your procedure. WHAT TO EXPECT AFTER THE PROCEDURE After your procedure, it is typical to have cramping in the pelvis. You will be given pain medicine to  control it. HOME CARE INSTRUCTIONS  Only take over-the-counter or prescription medicines for pain, discomfort, or fever as directed by your health care provider.  Do not take aspirin. It can cause bleeding.  Follow your health care provider's advice regarding medicines given to you, diet, activity, and when to begin sexual activity.  See your health care provider for follow-up care as  directed. SEEK MEDICAL CARE IF:  You have a fever.  You have redness, swelling, and pain around your incision site.  You have pus draining from your incision.  You have a rash. SEEK IMMEDIATE MEDICAL CARE IF:  You have bleeding from your incision site.  You have difficulty breathing.  You have chest pain.  You have severe abdominal pain.  You have leg pain.  You become dizzy and faint. Document Released: 04/02/2013 Document Reviewed: 12/26/2012 [facility] Patient Information 2014 [place].

## 2013-12-03 NOTE — Discharge Summary (Signed)
Agree with PA note.  Signed,  Afreen Siebels K. Donterrius Santucci, MD [facility]  

## 2013-12-31 ENCOUNTER — Ambulatory Visit
Admission: RE | Admit: 2013-12-31 | Discharge: 2013-12-31 | Disposition: A | Discharge status: Home / Self Care | Payer: BC Managed Care – PPO | Source: Ambulatory Visit | Attending: Radiology | Admitting: Radiology

## 2013-12-31 ENCOUNTER — Encounter (INDEPENDENT_AMBULATORY_CARE_PROVIDER_SITE_OTHER): Payer: Self-pay

## 2013-12-31 DIAGNOSIS — D219 Benign neoplasm of connective and other soft tissue, unspecified: Secondary | ICD-10-CM

## 2014-03-04 ENCOUNTER — Telehealth: Payer: Self-pay | Admitting: Radiology

## 2014-03-04 NOTE — Telephone Encounter (Signed)
3 mo post [place] call for status update.  LMP 02/15/2014  Patient states that Sx are improving.  Menstrual flow is lighter.  Dyspareunia resolved.  Urinary frequency & urgency improved.  Prefers to schedule follow up at 6 mos post [place].  Eesha Schmaltz Riki Rusk, RN 03/04/2014 11:48 AM

## 2014-07-02 ENCOUNTER — Other Ambulatory Visit (HOSPITAL_COMMUNITY): Payer: Self-pay | Admitting: Interventional Radiology

## 2014-07-02 DIAGNOSIS — D259 Leiomyoma of uterus, unspecified: Secondary | ICD-10-CM

## 2014-08-04 ENCOUNTER — Ambulatory Visit
Admission: RE | Admit: 2014-08-04 | Discharge: 2014-08-04 | Disposition: A | Discharge status: Home / Self Care | Payer: BLUE CROSS/BLUE SHIELD | Source: Ambulatory Visit | Attending: Interventional Radiology | Admitting: Interventional Radiology

## 2014-08-04 DIAGNOSIS — D259 Leiomyoma of uterus, unspecified: Secondary | ICD-10-CM

## 2014-08-04 HISTORY — PX: IR GENERIC HISTORICAL: IMG1180011

## 2014-08-04 NOTE — Progress Notes (Signed)
LMP:  07/10/2014 x 3 days.  Patient states that post [place], menstrual flow has decreased and without clots.  Denies cramping.    Also, has noted improvement re:  Urinary frequency and urgency.    Phoenyx Melka [place], RN 08/04/2014 10:10 AM

## 2014-08-04 NOTE — Progress Notes (Signed)
Patient ID: Cynthia Turner, female   DOB: 1970/08/28, 44 y.o.   MRN: [id number]   Chief Complaint: Chief Complaint  Patient presents with  . Follow-up    8 mo follow up [place]    Referring Physician(s): Dae Antonucci,Art A  History of Present Illness: Cynthia Turner is a 44 y.o. female who returns 8 months after [place]. Her symptoms have dramatically improved. She no longer passes clots and only has some bleeding for three days. Her pain is minimal but was enver her main problem. She denies fever and chills. She is satisfied with her result.  Past Medical History  Diagnosis Date  . Anemia     from fibroids  . Fibroids     Past Surgical History  Procedure Laterality Date  . Tubal ligation  2003    Allergies: Review of patient's allergies indicates no known allergies.  Medications: Prior to Admission medications   Medication Sig Start Date End Date Taking? Authorizing Provider  Biotin (BIOTIN 5000) 5 MG CAPS Take 5 mg by mouth daily.    Historical Provider, MD  docusate sodium 100 MG CAPS Take 100 mg by mouth 2 (two) times daily. 12/03/13   Ascencion Dike, PA-C  ferrous fumarate (HEMOCYTE - 106 MG FE) 325 (106 FE) MG TABS tablet Take 1 tablet by mouth daily.    Historical Provider, MD  HYDROcodone-acetaminophen (NORCO/VICODIN) 5-325 MG per tablet Take 1-2 tablets by mouth every 4 (four) hours as needed for moderate pain or severe pain. 12/03/13   Ascencion Dike, PA-C  ibuprofen (ADVIL,MOTRIN) 800 MG tablet Take 1 tablet (800 mg total) by mouth 3 (three) times daily. 12/03/13   Ascencion Dike, PA-C  Multiple Vitamin (MULTIVITAMIN WITH MINERALS) TABS tablet Take 1 tablet by mouth daily.    Historical Provider, MD  promethazine (PHENERGAN) 12.5 MG tablet Take 1 tablet (12.5 mg total) by mouth every 6 (six) hours as needed for nausea. 12/03/13   Ascencion Dike, PA-C    No family history on file.  History   Social History  . Marital Status: Married    Spouse Name: N/A    Number of Children:  N/A  . Years of Education: N/A   Social History Main Topics  . Smoking status: Never Smoker   . Smokeless tobacco: Never Used  . Alcohol Use: No  . Drug Use: No  . Sexual Activity: Not on file   Other Topics Concern  . Not on file   Social History Narrative  . No narrative on file      Review of Systems: A 12 point ROS discussed and pertinent positives are indicated in the HPI above.  All other systems are negative.  Review of Systems  Vital Signs: BP 104/60 mmHg  Pulse 86  Temp(Src) 98.4 F (36.9 C) (Oral)  Resp 14  SpO2 100%  LMP 07/10/2014 (Approximate)  Physical Exam  Constitutional: She is oriented to person, place, and time. She appears well-developed and well-nourished.  Musculoskeletal:  R groin without palpable mass. Pulses intact distally.  Neurological: She is alert and oriented to person, place, and time.  Skin: Skin is warm and dry.    Imaging: No results found.  Labs:  CBC:  Recent Labs  12/02/13 0734  WBC 5.4  HGB 7.9*  HCT 26.4*  PLT 362    COAGS:  Recent Labs  12/02/13 0734  INR 0.99  APTT 27    BMP:  Recent Labs  12/02/13 0734  NA 139  K 4.1  CL  104  CO2 26  GLUCOSE 90  BUN 9  CALCIUM 9.3  CREATININE 0.84  GFRNONAA 85*  GFRAA >90    LIVER FUNCTION TESTS: No results for input(s): BILITOT, AST, ALT, ALKPHOS, PROT, ALBUMIN in the last 8760 hours.  TUMOR MARKERS: No results for input(s): AFPTM, CEA, CA199, CHROMGRNA in the last 8760 hours.  Assessment and Plan:  Cynthia Turner has undergone successful [place]. She is to contact us if her symptoms recurr.   Signed: Maleny Candy, ART A 08/04/2014, 11:05 AM   I spent a total of 15 minutes face to face in clinical consultation, greater than 50% of which was counseling/coordinating care for [place].

## 2014-09-21 ENCOUNTER — Ambulatory Visit: Payer: Self-pay

## 2016-06-28 ENCOUNTER — Encounter: Payer: Self-pay | Admitting: Interventional Radiology

## 2017-10-09 DIAGNOSIS — E559 Vitamin D deficiency, unspecified: Secondary | ICD-10-CM | POA: Insufficient documentation

## 2018-07-08 ENCOUNTER — Emergency Department
Admission: EM | Admit: 2018-07-08 | Discharge: 2018-07-08 | Disposition: A | Discharge status: Home / Self Care | Payer: No Typology Code available for payment source | Attending: Student in an Organized Health Care Education/Training Program | Admitting: Student in an Organized Health Care Education/Training Program

## 2018-07-08 ENCOUNTER — Encounter: Payer: Self-pay | Admitting: Emergency Medicine

## 2018-07-08 DIAGNOSIS — M7918 Myalgia, other site: Secondary | ICD-10-CM | POA: Diagnosis present

## 2018-07-08 DIAGNOSIS — Z79899 Other long term (current) drug therapy: Secondary | ICD-10-CM | POA: Insufficient documentation

## 2018-07-08 MED ORDER — CYCLOBENZAPRINE HCL 5 MG PO TABS: 5.0000 mg | ORAL_TABLET | Freq: Three times a day (TID) | ORAL | 0 refills | Status: DC | PRN
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
## 2018-07-08 NOTE — ED Notes (Signed)
Pt is being discharged to home. Pt is AOx4, VSS, she denies any pain at this time and does not show any signs of distress. AVS was given and explained to the pt and she verbalized understanding all information.
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
## 2019-07-06 ENCOUNTER — Other Ambulatory Visit: Payer: Self-pay

## 2019-07-06 ENCOUNTER — Ambulatory Visit
Admission: EM | Admit: 2019-07-06 | Discharge: 2019-07-06 | Disposition: A | Discharge status: Home / Self Care | Payer: BLUE CROSS/BLUE SHIELD | Attending: Family Medicine | Admitting: Family Medicine

## 2019-07-06 ENCOUNTER — Encounter: Payer: Self-pay | Admitting: Emergency Medicine

## 2019-07-06 DIAGNOSIS — J069 Acute upper respiratory infection, unspecified: Secondary | ICD-10-CM | POA: Diagnosis not present

## 2019-07-06 DIAGNOSIS — Z20822 Contact with and (suspected) exposure to covid-19: Secondary | ICD-10-CM | POA: Insufficient documentation

## 2019-07-06 DIAGNOSIS — R0981 Nasal congestion: Secondary | ICD-10-CM | POA: Diagnosis present

## 2019-07-06 NOTE — ED Triage Notes (Signed)
Pt c/o runny nose. Started about 2 days ago. 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

## 2019-07-06 NOTE — ED Provider Notes (Signed)
[facility]    CSN: [id number] Arrival date & time: 07/06/19  1208      History   Chief Complaint Chief Complaint  Patient presents with  . covid testing  . Nasal Congestion    HPI Cynthia Turner is a 49 y.o. female.   49 yo female with a c/o runny nose for the past 2 days. Denies any fevers, chills, chest pains or shortness of breath. Possible covid exposure.      Past Medical History:  Diagnosis Date  . Anemia    from fibroids  . Fibroids     Patient Active Problem List   Diagnosis Date Noted  . Fibroids 12/02/2013    Past Surgical History:  Procedure Laterality Date  . IR GENERIC HISTORICAL  08/04/2014   IR RADIOLOGIST EVAL & MGMT 08/04/2014 Marybelle Killings, MD [facility] INTERV RAD  . TUBAL LIGATION  2003    OB History   No obstetric history on file.      Home Medications    Prior to Admission medications   Medication Sig Start Date End Date Taking? Authorizing Provider  cyclobenzaprine (FLEXERIL) 5 MG tablet Take 1 tablet (5 mg total) by mouth 3 (three) times daily as needed for muscle spasms. 07/08/18   Menshew, Dannielle Karvonen, PA-C  docusate sodium 100 MG CAPS Take 100 mg by mouth 2 (two) times daily. 12/03/13   Ascencion Dike, PA-C    Family History Family History  Problem Relation Age of Onset  . Healthy Mother   . Multiple sclerosis Father     Social History Social History   Tobacco Use  . Smoking status: Never Smoker  . Smokeless tobacco: Never Used  Substance Use Topics  . Alcohol use: No  . Drug use: No     Allergies   Patient has no known allergies.   Review of Systems Review of Systems   Physical Exam Triage Vital Signs ED Triage Vitals  Enc Vitals Group     BP 07/06/19 1232 127/77     Pulse Rate 07/06/19 1232 80     Resp 07/06/19 1232 18     Temp 07/06/19 1232 98.4 F (36.9 C)     Temp Source 07/06/19 1232 Oral     SpO2 07/06/19 1232 100 %     Weight 07/06/19 1230 185 lb (83.9 kg)     Height  07/06/19 1230 5\' 9"  (1.753 m)     Head Circumference --      Peak Flow --      Pain Score 07/06/19 1229 0     Pain Loc --      Pain Edu? --      Excl. in [place]? --    No data found.  Updated Vital Signs BP 127/77 (BP Location: Left Arm)   Pulse 80   Temp 98.4 F (36.9 C) (Oral)   Resp 18   Ht 5\' 9"  (1.753 m)   Wt 83.9 kg   LMP 06/17/2019 (Approximate)   SpO2 100%   BMI 27.32 kg/m   Visual Acuity Right Eye Distance:   Left Eye Distance:   Bilateral Distance:    Right Eye Near:   Left Eye Near:    Bilateral Near:     Physical Exam Vitals and nursing note reviewed.  Constitutional:      General: She is not in acute distress.    Appearance: She is not toxic-appearing or diaphoretic.  Cardiovascular:     Rate and Rhythm: Normal  rate.  Pulmonary:     Effort: Pulmonary effort is normal. No respiratory distress.  Neurological:     Mental Status: She is alert.      UC Treatments / Results  Labs (all labs ordered are listed, but only abnormal results are displayed) Labs Reviewed  NOVEL CORONAVIRUS, NAA (HOSP ORDER, SEND-OUT TO REF LAB; TAT 18-24 HRS)    EKG   Radiology No results found.  Procedures Procedures (including critical care time)  Medications Ordered in UC Medications - No data to display  Initial Impression / Assessment and Plan / UC Course  I have reviewed the triage vital signs and the nursing notes.  Pertinent labs & imaging results that were available during my care of the patient were reviewed by me and considered in my medical decision making (see chart for details).      Final Clinical Impressions(s) / UC Diagnoses   Final diagnoses:  Viral URI     Discharge Instructions     Rest, fluids, over the counter medications as needed    ED Prescriptions    None      1. diagnosis reviewed with patient 2.covid test done 3. Recommend supportive treatment as above 4. Follow-up prn if symptoms worsen or don't improve   PDMP  not reviewed this encounter.   Norval Gable, MD 07/06/19 1300

## 2019-07-06 NOTE — Discharge Instructions (Signed)
Rest, fluids, over the counter medications as needed  

## 2019-07-07 LAB — NOVEL CORONAVIRUS, NAA (HOSP ORDER, SEND-OUT TO REF LAB; TAT 18-24 HRS): SARS-CoV-2, NAA: NOT DETECTED

## 2020-01-02 ENCOUNTER — Other Ambulatory Visit: Payer: Self-pay

## 2020-01-02 DIAGNOSIS — R1012 Left upper quadrant pain: Secondary | ICD-10-CM | POA: Diagnosis not present

## 2020-01-02 LAB — URINALYSIS, COMPLETE (UACMP) WITH MICROSCOPIC
Bilirubin Urine: NEGATIVE
Glucose, UA: NEGATIVE mg/dL
Hgb urine dipstick: NEGATIVE
Ketones, ur: NEGATIVE mg/dL
Nitrite: NEGATIVE
Protein, ur: NEGATIVE mg/dL
Specific Gravity, Urine: 1.011 (ref 1.005–1.030)
pH: 6 (ref 5.0–8.0)

## 2020-01-02 LAB — CBC
HCT: 31.7 % — ABNORMAL LOW (ref 36.0–46.0)
Hemoglobin: 10.1 g/dL — ABNORMAL LOW (ref 12.0–15.0)
MCH: 25.9 pg — ABNORMAL LOW (ref 26.0–34.0)
MCHC: 31.9 g/dL (ref 30.0–36.0)
MCV: 81.3 fL (ref 80.0–100.0)
Platelets: 288 10*3/uL (ref 150–400)
RBC: 3.9 MIL/uL (ref 3.87–5.11)
RDW: 16.6 % — ABNORMAL HIGH (ref 11.5–15.5)
WBC: 6.7 10*3/uL (ref 4.0–10.5)
nRBC: 0 % (ref 0.0–0.2)

## 2020-01-02 LAB — COMPREHENSIVE METABOLIC PANEL
ALT: 20 U/L (ref 0–44)
AST: 22 U/L (ref 15–41)
Albumin: 4.7 g/dL (ref 3.5–5.0)
Alkaline Phosphatase: 55 U/L (ref 38–126)
Anion gap: 9 (ref 5–15)
BUN: 9 mg/dL (ref 6–20)
CO2: 24 mmol/L (ref 22–32)
Calcium: 9.3 mg/dL (ref 8.9–10.3)
Chloride: 102 mmol/L (ref 98–111)
Creatinine, Ser: 1.05 mg/dL — ABNORMAL HIGH (ref 0.44–1.00)
GFR calc Af Amer: >60 mL/min (ref >60)
GFR calc non Af Amer: >60 mL/min (ref >60)
Glucose, Bld: 96 mg/dL (ref 70–99)
Potassium: 3.7 mmol/L (ref 3.5–5.1)
Sodium: 135 mmol/L (ref 135–145)
Total Bilirubin: 0.9 mg/dL (ref 0.3–1.2)
Total Protein: 7.9 g/dL (ref 6.5–8.1)

## 2020-01-02 LAB — LIPASE, BLOOD: Lipase: 39 U/L (ref 11–51)

## 2020-01-02 NOTE — ED Triage Notes (Signed)
Patient c/o mid/lateral left abdominal pain beginning Tuesday. Patient denies N/V/D.

## 2020-01-03 ENCOUNTER — Emergency Department: Payer: BLUE CROSS/BLUE SHIELD

## 2020-01-03 ENCOUNTER — Emergency Department
Admission: EM | Admit: 2020-01-03 | Discharge: 2020-01-03 | Disposition: A | Discharge status: Home / Self Care | Payer: BLUE CROSS/BLUE SHIELD | Attending: Emergency Medicine | Admitting: Emergency Medicine

## 2020-01-03 DIAGNOSIS — T148XXA Other injury of unspecified body region, initial encounter: Secondary | ICD-10-CM

## 2020-01-03 DIAGNOSIS — R1012 Left upper quadrant pain: Secondary | ICD-10-CM

## 2020-01-03 LAB — PREGNANCY, URINE: Preg Test, Ur: NEGATIVE

## 2020-01-03 LAB — FIBRIN DERIVATIVES D-DIMER (ARMC ONLY): Fibrin derivatives D-dimer (ARMC): 195.58 ng/mL (FEU) (ref 0.00–499.00)

## 2020-01-03 MED ORDER — CYCLOBENZAPRINE HCL 10 MG PO TABS: 10.0000 mg | ORAL_TABLET | Freq: Three times a day (TID) | ORAL | 0 refills | Status: DC | PRN

## 2020-01-03 MED ORDER — KETOROLAC TROMETHAMINE 30 MG/ML IJ SOLN
15.0000 mg | Freq: Once | INTRAMUSCULAR | Status: AC
  Administered 2020-01-03: 15 mg via INTRAVENOUS
  Filled 2020-01-03: qty 1

## 2020-01-03 MED ORDER — IBUPROFEN 600 MG PO TABS: 600.0000 mg | ORAL_TABLET | Freq: Four times a day (QID) | ORAL | 0 refills | Status: DC | PRN

## 2020-01-03 MED ORDER — CYCLOBENZAPRINE HCL 10 MG PO TABS
10.0000 mg | ORAL_TABLET | Freq: Once | ORAL | Status: AC
  Administered 2020-01-03: 10 mg via ORAL
  Filled 2020-01-03: qty 1

## 2020-01-03 NOTE — ED Provider Notes (Signed)
[facility] Emergency Department Provider Note  ____________________________________________  Time seen: Approximately 1:41 AM  I have reviewed the triage vital signs and the nursing notes.   HISTORY  Chief Complaint Abdominal Pain   HPI Cynthia Turner is a 49 y.o. female with history of uterine fibroids status post ablation who presents for evaluation of abdominal pain.   Patient reports 3 days of constant pain located in the left lateral upper abdominal area.  She describes the pain as a soreness or a muscle pull that is worse with palpation and movement of the torso.  The pain is worse with deep inspiration but she denies chest pain, shortness of breath or cough.  No nausea, vomiting, diarrhea or constipation.  No prior history of kidney stones, pyelonephritis, or UTI.  Patient reports that she woke up with this pain.  Denies lifting anything heavy or any trauma.  She denies any personal or family history of blood clots, recent travel immobilization, leg pain or swelling, hemoptysis, or exogenous hormones.  Past Medical History:  Diagnosis Date  . Anemia    from fibroids  . Fibroids     Patient Active Problem List   Diagnosis Date Noted  . Fibroids 12/02/2013    Past Surgical History:  Procedure Laterality Date  . IR GENERIC HISTORICAL  08/04/2014   IR RADIOLOGIST EVAL & MGMT 08/04/2014 Marybelle Killings, MD [facility] INTERV RAD  . TUBAL LIGATION  2003    Prior to Admission medications   Medication Sig Start Date End Date Taking? Authorizing Provider  acetaminophen (TYLENOL 8 HOUR) 650 MG CR tablet Take 1,300 mg by mouth every 8 (eight) hours as needed for pain.   Yes [provider]  docusate sodium 100 MG CAPS Take 100 mg by mouth 2 (two) times daily. 12/03/13  Yes Bruning, Lennette Bihari, PA-C  Multiple Vitamin (MULTIVITAMIN WITH MINERALS) TABS tablet Take 1 tablet by mouth daily.   Yes [provider]  cyclobenzaprine (FLEXERIL) 10 MG  tablet Take 1 tablet (10 mg total) by mouth 3 (three) times daily as needed for muscle spasms. 01/03/20   Rudene Re, MD  ibuprofen (ADVIL) 600 MG tablet Take 1 tablet (600 mg total) by mouth every 6 (six) hours as needed. 01/03/20   Rudene Re, MD    Allergies Patient has no known allergies.  Family History  Problem Relation Age of Onset  . Healthy Mother   . Multiple sclerosis Father     Social History Social History   Tobacco Use  . Smoking status: Never Smoker  . Smokeless tobacco: Never Used  Vaping Use  . Vaping Use: Never used  Substance Use Topics  . Alcohol use: Yes  . Drug use: No    Review of Systems  Constitutional: Negative for fever. Eyes: Negative for visual changes. ENT: Negative for sore throat. Neck: No neck pain  Cardiovascular: Negative for chest pain. Respiratory: Negative for shortness of breath. Gastrointestinal: + LU and lateral abdominal pain. No vomiting or diarrhea. Genitourinary: Negative for dysuria. Musculoskeletal: Negative for back pain. Skin: Negative for rash. Neurological: Negative for headaches, weakness or numbness. Psych: No SI or HI  ____________________________________________   PHYSICAL EXAM:  VITAL SIGNS: ED Triage Vitals [01/02/20 2100]  Enc Vitals Group     BP (!) 124/58     Pulse Rate 77     Resp 18     Temp 98.6 F (37 C)     Temp src      SpO2 100 %  Weight 190 lb (86.2 kg)     Height 5\' 9"  (1.753 m)     Head Circumference      Peak Flow      Pain Score 10     Pain Loc      Pain Edu?      Excl. in [place]?     Constitutional: Alert and oriented. Well appearing and in no apparent distress. HEENT:      Head: Normocephalic and atraumatic.         Eyes: Conjunctivae are normal. Sclera is non-icteric.       Mouth/Throat: Mucous membranes are moist.       Neck: Supple with no signs of meningismus. Cardiovascular: Regular rate and rhythm. No murmurs, gallops, or rubs. 2+ symmetrical distal  pulses are present in all extremities. No JVD. Respiratory: Normal respiratory effort. Lungs are clear to auscultation bilaterally. No wheezes, crackles, or rhonchi.  Gastrointestinal: Soft, tender to palpation over the left upper lateral abdominal area, and non distended with positive bowel sounds. No rebound or guarding. Genitourinary: No CVA tenderness. Musculoskeletal: Nontender with normal range of motion in all extremities. No edema, cyanosis, or erythema of extremities.  No midline tenderness spine tenderness Neurologic: Normal speech and language. Face is symmetric. Moving all extremities. No gross focal neurologic deficits are appreciated. Skin: Skin is warm, dry and intact. No rash noted. Psychiatric: Mood and affect are normal. Speech and behavior are normal.  ____________________________________________   LABS (all labs ordered are listed, but only abnormal results are displayed)  Labs Reviewed  COMPREHENSIVE METABOLIC PANEL - Abnormal; Notable for the following components:      Result Value   Creatinine, Ser 1.05 (*)    All other components within normal limits  CBC - Abnormal; Notable for the following components:   Hemoglobin 10.1 (*)    HCT 31.7 (*)    MCH 25.9 (*)    RDW 16.6 (*)    All other components within normal limits  URINALYSIS, COMPLETE (UACMP) WITH MICROSCOPIC - Abnormal; Notable for the following components:   Color, Urine YELLOW (*)    APPearance HAZY (*)    Leukocytes,Ua TRACE (*)    Bacteria, UA RARE (*)    All other components within normal limits  LIPASE, BLOOD  PREGNANCY, URINE  FIBRIN DERIVATIVES D-DIMER ([facility] ONLY)  POC URINE PREG, ED   ____________________________________________  EKG  none  ____________________________________________  RADIOLOGY  I have personally reviewed the images performed during this visit and I agree with the Radiologist's read.   Interpretation by Radiologist:  CT ABDOMEN PELVIS WO CONTRAST  Result Date:  01/03/2020 CLINICAL DATA:  Left flank pain EXAM: CT ABDOMEN AND PELVIS WITHOUT CONTRAST TECHNIQUE: Multidetector CT imaging of the abdomen and pelvis was performed following the standard protocol without IV contrast. COMPARISON:  None. FINDINGS: LOWER CHEST: Normal. HEPATOBILIARY: There is a cyst in the right hepatic lobe that measures 4.3 cm. There is a 1.0 cm cyst in the left hepatic lobe. The gallbladder is normal. PANCREAS: Normal pancreas. No ductal dilatation or peripancreatic fluid collection. SPLEEN: Normal. ADRENALS/URINARY TRACT: The adrenal glands are normal. No hydronephrosis, nephroureterolithiasis or solid renal mass. The urinary bladder is normal for degree of distention STOMACH/BOWEL: There is no hiatal hernia. Normal duodenal course and caliber. No small bowel dilatation or inflammation. No focal colonic abnormality. Normal appendix. VASCULAR/LYMPHATIC: Normal course and caliber of the major abdominal vessels. No abdominal or pelvic lymphadenopathy. REPRODUCTIVE: There multiple partially calcified uterine fibroids including a large peripherally  calcified fibroid that measures 6.6 x 6.5 cm MUSCULOSKELETAL. No bony spinal canal stenosis or focal osseous abnormality. OTHER: None. IMPRESSION: 1. No obstructive uropathy or nephrolithiasis. 2. Multiple partially calcified uterine fibroids including a large peripherally calcified fibroid measuring up to 6.6 cm. Electronically Signed   By: Ulyses Jarred M.D.   On: 01/03/2020 01:35     ____________________________________________   PROCEDURES  Procedure(s) performed:yes .1-3 Lead EKG Interpretation Performed by: Rudene Re, MD Authorized by: Rudene Re, MD     Interpretation: normal     ECG rate assessment: normal     Rhythm: sinus rhythm     Critical Care performed:  None ____________________________________________   INITIAL IMPRESSION / ASSESSMENT AND PLAN / ED COURSE   49 y.o. female with history of uterine  fibroids status post ablation who presents for evaluation of constant sore abdominal pain over the L lateral upper area with no associated symptoms.  Pain is worse with movement of the torso and palpation of that area.  There is no rash, abdomen is otherwise flat and nontender throughout, no CVA tenderness, no midline T and L-spine tenderness.  Vitals are within normal limits with no tachycardia, tachypnea, or hypoxia.  Differential diagnosis including kidney stone versus MSK versus costochondritis versus shingles versus pyelonephritis versus pneumonia versus PE.  Labs showing no leukocytosis, improved anemia, normal lipase, normal LFTs, no electrolyte derangements.  UA with no evidence of blood or UTI.  Pregnancy test negative.  CT renal with no evidence of kidney stones or any other acute findings other than large uterine fibroids, confirmed by radiology.  D-dimer pending. After receiving IV toradol pain has resolved and only minimal discomfort with palpation.   Old medical records reviewed.  History gathered from patient and family members at bedside.  Plan discussed with both of them.  _________________________ 3:35 AM on 01/03/2020 -----------------------------------------  Patient remains well-appearing with pain well controlled. D-dimer negative. Will discharge home with a muscle relaxant and anti-inflammatory. Recommended heat and follow-up with PCP. Discussed my return precautions for new or worsening abdominal pain, fever, chest pain, shortness of breath, nausea or vomiting. Plan discussed with patient and family member who is at bedside.    _____________________________________________ Please note:  Patient was evaluated in Emergency Department today for the symptoms described in the history of present illness. Patient was evaluated in the context of the global COVID-19 pandemic, which necessitated consideration that the patient might be at risk for infection with the SARS-CoV-2 virus  that causes COVID-19. Institutional protocols and algorithms that pertain to the evaluation of patients at risk for COVID-19 are in a state of rapid change based on information released by regulatory bodies including the CDC and federal and state organizations. These policies and algorithms were followed during the patient's care in the ED.  Some ED evaluations and interventions may be delayed as a result of limited staffing during the pandemic.   [place] Controlled Substance Database was reviewed by me. ____________________________________________   FINAL CLINICAL IMPRESSION(S) / ED DIAGNOSES   Final diagnoses:  Left upper quadrant abdominal pain  Muscle strain      NEW MEDICATIONS STARTED DURING THIS VISIT:  ED Discharge Orders         Ordered    cyclobenzaprine (FLEXERIL) 10 MG tablet  3 times daily PRN     Discontinue  Reprint     01/03/20 0334    ibuprofen (ADVIL) 600 MG tablet  Every 6 hours PRN     Discontinue  Reprint  01/03/20 0334           Note:  This document was prepared using Dragon voice recognition software and may include unintentional dictation errors.    Alfred Levins, [place], MD 01/03/20 [id number]

## 2020-01-03 NOTE — ED Notes (Signed)
Visitor given warm blankets. Pt declined more warm blankets. Lights dimmed for pt. Bed locked low. Rail up. Call bell within reach.

## 2020-01-03 NOTE — ED Notes (Signed)
Called lab about urine preg as not yet resulted. States they'll run it now.

## 2020-01-03 NOTE — ED Notes (Addendum)
EDP Veronese at bedside. Visitor at bedside with pt.

## 2020-01-03 NOTE — ED Notes (Signed)
Pt and visitor at bedside both asleep.

## 2020-01-03 NOTE — ED Notes (Signed)
Report given to [facility] as well.

## 2020-01-03 NOTE — Discharge Instructions (Addendum)

## 2020-01-03 NOTE — ED Notes (Signed)
Pt c/o abd pain that started Tuesday. L side is "sore" per pt "like the tightening of a muscle". Denies back pain but then stated "its like my back and spine are now sore from the pain". Denies burning upon urination. Frequent urination per pt but states has inc water intake as well. Denies history of kidney stones, gallstones, and UTIs. Then stated "its been years since I had a UTI". Resting calmly in bed.

## 2020-09-08 ENCOUNTER — Other Ambulatory Visit: Payer: Self-pay

## 2020-09-08 ENCOUNTER — Telehealth (INDEPENDENT_AMBULATORY_CARE_PROVIDER_SITE_OTHER): Payer: Self-pay | Admitting: Gastroenterology

## 2020-09-08 DIAGNOSIS — Z1211 Encounter for screening for malignant neoplasm of colon: Secondary | ICD-10-CM

## 2020-09-08 DIAGNOSIS — D649 Anemia, unspecified: Secondary | ICD-10-CM | POA: Insufficient documentation

## 2020-09-08 MED ORDER — GOLYTELY 236 G PO SOLR: 4000.0000 mL | Freq: Once | ORAL | 0 refills | Status: AC

## 2020-09-08 NOTE — Progress Notes (Signed)
Gastroenterology Pre-Procedure Review  Request Date: 09/27/20 Requesting Physician: Dr. Bonna Gains  PATIENT REVIEW QUESTIONS: The patient responded to the following health history questions as indicated:    1. Are you having any GI issues? no 2. Do you have a personal history of Polyps? no 3. Do you have a family history of Colon Cancer or Polyps? no 4. Diabetes Mellitus? no 5. Joint replacements in the past 12 months?no 6. Major health problems in the past 3 months?no 7. Any artificial heart valves, MVP, or defibrillator?no    MEDICATIONS & ALLERGIES:    Patient reports the following regarding taking any anticoagulation/antiplatelet therapy:   Plavix, Coumadin, Eliquis, Xarelto, Lovenox, Pradaxa, Brilinta, or Effient? no Aspirin? no  Patient confirms/reports the following medications:  Current Outpatient Medications  Medication Sig Dispense Refill  . acetaminophen (TYLENOL) 650 MG CR tablet Take 1,300 mg by mouth every 8 (eight) hours as needed for pain.    Marland Kitchen docusate sodium 100 MG CAPS Take 100 mg by mouth 2 (two) times daily. 20 capsule 0  . ibuprofen (ADVIL) 600 MG tablet Take 1 tablet (600 mg total) by mouth every 6 (six) hours as needed. 20 tablet 0  . Multiple Vitamin (MULTIVITAMIN WITH MINERALS) TABS tablet Take 1 tablet by mouth daily.    . cyclobenzaprine (FLEXERIL) 10 MG tablet Take 1 tablet (10 mg total) by mouth 3 (three) times daily as needed for muscle spasms. (Patient not taking: Reported on 09/08/2020) 30 tablet 0  . polyethylene glycol (GOLYTELY) 236 g solution Take 4,000 mLs by mouth once for 1 dose. Fill container to the fill line with clear liquid.  Mix well.  Drink 8 oz every 30 minutes until entire contents have been completed. 4000 mL 0   No current facility-administered medications for this visit.    Patient confirms/reports the following allergies:  No Known Allergies  Orders Placed This Encounter  Procedures  . Procedural/ Surgical Case Request:  COLONOSCOPY WITH PROPOFOL    Standing Status:   Standing    Number of Occurrences:   1    Order Specific Question:   Pre-op diagnosis    Answer:   screening colonoscopy    Order Specific Question:   CPT Code    Answer:   67124    AUTHORIZATION INFORMATION Primary Insurance: 1D#: Group #:  Secondary Insurance: 1D#: Group #:  SCHEDULE INFORMATION: Date: 09/27/20 Time: Location:[facility]

## 2020-09-20 ENCOUNTER — Other Ambulatory Visit: Payer: Self-pay

## 2020-09-20 DIAGNOSIS — Z1211 Encounter for screening for malignant neoplasm of colon: Secondary | ICD-10-CM

## 2020-09-20 MED ORDER — GOLYTELY 236 G PO SOLR: 4000.0000 mL | Freq: Once | ORAL | 0 refills | Status: AC

## 2020-10-21 ENCOUNTER — Telehealth: Payer: Self-pay

## 2020-10-21 NOTE — Telephone Encounter (Signed)
Patients call has been returned to reschedule her colonoscopy.  Her colonoscopy has been rescheduled to 11/15/20 still with Dr. Bonna Gains.  Thanks,  [place], [place]

## 2020-11-11 ENCOUNTER — Other Ambulatory Visit: Payer: Self-pay

## 2020-11-11 MED ORDER — PEG 3350-KCL-NA BICARB-NACL 420 G PO SOLR: ORAL | 0 refills | Status: AC

## 2020-11-15 ENCOUNTER — Encounter
Admission: RE | Disposition: A | Discharge status: Home / Self Care | Payer: Self-pay | Source: Home / Self Care | Attending: Gastroenterology

## 2020-11-15 ENCOUNTER — Ambulatory Visit
Admission: RE | Admit: 2020-11-15 | Discharge: 2020-11-15 | Disposition: A | Discharge status: Home / Self Care | Payer: 59 | Attending: Gastroenterology | Admitting: Gastroenterology

## 2020-11-15 ENCOUNTER — Encounter: Payer: Self-pay | Admitting: Gastroenterology

## 2020-11-15 ENCOUNTER — Ambulatory Visit: Payer: 59 | Admitting: Registered Nurse

## 2020-11-15 ENCOUNTER — Other Ambulatory Visit: Payer: Self-pay

## 2020-11-15 DIAGNOSIS — Z79899 Other long term (current) drug therapy: Secondary | ICD-10-CM | POA: Diagnosis not present

## 2020-11-15 DIAGNOSIS — Z1211 Encounter for screening for malignant neoplasm of colon: Secondary | ICD-10-CM | POA: Diagnosis present

## 2020-11-15 HISTORY — PX: COLONOSCOPY WITH PROPOFOL: SHX5780

## 2020-11-15 LAB — POCT PREGNANCY, URINE: Preg Test, Ur: NEGATIVE

## 2020-11-15 SURGERY — COLONOSCOPY WITH PROPOFOL
Anesthesia: General

## 2020-11-15 MED ORDER — PHENYLEPHRINE HCL (PRESSORS) 10 MG/ML IV SOLN
INTRAVENOUS | Status: DC | PRN
  Administered 2020-11-15 (×2): 100 ug via INTRAVENOUS

## 2020-11-15 MED ORDER — PROPOFOL 500 MG/50ML IV EMUL
INTRAVENOUS | Status: DC | PRN
  Administered 2020-11-15: 140 ug/kg/min via INTRAVENOUS

## 2020-11-15 MED ORDER — SODIUM CHLORIDE 0.9 % IV SOLN
INTRAVENOUS | Status: DC
  Administered 2020-11-15: 20 mL/h via INTRAVENOUS

## 2020-11-15 MED ORDER — EPHEDRINE SULFATE 50 MG/ML IJ SOLN
INTRAMUSCULAR | Status: DC | PRN
  Administered 2020-11-15: 5 mg via INTRAVENOUS
  Administered 2020-11-15: 10 mg via INTRAVENOUS

## 2020-11-15 MED ORDER — PROPOFOL 10 MG/ML IV BOLUS
INTRAVENOUS | Status: DC | PRN
  Administered 2020-11-15: 70 mg via INTRAVENOUS

## 2020-11-15 NOTE — Transfer of Care (Signed)
Immediate Anesthesia Transfer of Care Note  Patient: Cynthia Turner  Procedure(s) Performed: COLONOSCOPY WITH PROPOFOL (N/A )  Patient Location: PACU  Anesthesia Type:General  Level of Consciousness: sedated  Airway & Oxygen Therapy: Patient Spontanous Breathing and Patient connected to nasal cannula oxygen  Post-op Assessment: Report given to RN and Post -op Vital signs reviewed and stable  Post vital signs: Reviewed and stable  Last Vitals:  Vitals Value Taken Time  BP 100/49 11/15/20 0806  Temp 36.1 C 11/15/20 0805  Pulse 64 11/15/20 0810  Resp 15 11/15/20 0810  SpO2 100 % 11/15/20 0810  Vitals shown include unvalidated device data.  Last Pain:  Vitals:   11/15/20 0805  TempSrc: Tympanic  PainSc: Asleep         Complications: No complications documented.

## 2020-11-15 NOTE — H&P (Signed)
Vonda Antigua, MD [address] [place], [place], [place], [address] [address], [place], [place], [place], [address] Phone: [phone]  Fax: [phone]  Primary Care Physician:  Eldred Manges, MD (Inactive)   Pre-Procedure History & Physical: HPI:  Cynthia Turner is a 50 y.o. female is here for a colonoscopy.   Past Medical History:  Diagnosis Date  . Anemia    from fibroids  . Fibroids     Past Surgical History:  Procedure Laterality Date  . IR GENERIC HISTORICAL  08/04/2014   IR RADIOLOGIST EVAL & MGMT 08/04/2014 Marybelle Killings, MD [facility] INTERV RAD  . TUBAL LIGATION  2003    Prior to Admission medications   Medication Sig Start Date End Date Taking? Authorizing Provider  acetaminophen (TYLENOL) 650 MG CR tablet Take 1,300 mg by mouth every 8 (eight) hours as needed for pain.   Yes [provider]  docusate sodium 100 MG CAPS Take 100 mg by mouth 2 (two) times daily. 12/03/13  Yes Bruning, Lennette Bihari, PA-C  ibuprofen (ADVIL) 600 MG tablet Take 1 tablet (600 mg total) by mouth every 6 (six) hours as needed. 01/03/20  Yes Veronese, [place], MD  Multiple Vitamin (MULTIVITAMIN WITH MINERALS) TABS tablet Take 1 tablet by mouth daily.   Yes [provider]  polyethylene glycol-electrolytes (GAVILYTE-N WITH FLAVOR PACK) 420 g solution Drink one 8 oz glass every 20 mins until entire container is finished starting at 5:00pm on 11/14/20 11/11/20  Yes Lucilla Lame, MD  cyclobenzaprine (FLEXERIL) 10 MG tablet Take 1 tablet (10 mg total) by mouth 3 (three) times daily as needed for muscle spasms. Patient not taking: Reported on 09/08/2020 01/03/20   Rudene Re, MD    Allergies as of 09/08/2020  . (No Known Allergies)    Family History  Problem Relation Age of Onset  . Healthy Mother   . Multiple sclerosis Father     Social History   Socioeconomic History  . Marital status: Married    Spouse name: Not on file  . Number of children: Not on  file  . Years of education: Not on file  . Highest education level: Not on file  Occupational History  . Not on file  Tobacco Use  . Smoking status: Never Smoker  . Smokeless tobacco: Never Used  Vaping Use  . Vaping Use: Never used  Substance and Sexual Activity  . Alcohol use: Yes  . Drug use: No  . Sexual activity: Not on file  Other Topics Concern  . Not on file  Social History Narrative  . Not on file   Social Determinants of Health   Financial Resource Strain: Not on file  Food Insecurity: Not on file  Transportation Needs: Not on file  Physical Activity: Not on file  Stress: Not on file  Social Connections: Not on file  Intimate Partner Violence: Not on file    Review of Systems: See HPI, otherwise negative ROS  Physical Exam: BP 99/64   Temp (!) 97.4 F (36.3 C) (Temporal)   Resp 20   Ht 5\' 9"  (1.753 m)   Wt 81.6 kg   SpO2 100%   BMI 26.58 kg/m  General:   Alert,  pleasant and cooperative in NAD Head:  Normocephalic and atraumatic. Neck:  Supple; no masses or thyromegaly. Lungs:  Clear throughout to auscultation, normal respiratory effort.    Heart:  +S1, +S2, Regular rate and rhythm, No edema. Abdomen:  Soft, nontender and nondistended. Normal bowel sounds,  without guarding, and without rebound.   Neurologic:  Alert and  oriented x4;  grossly normal neurologically.  Impression/Plan: Cynthia Turner is here for a colonoscopy to be performed for average risk screening.  Risks, benefits, limitations, and alternatives regarding  colonoscopy have been reviewed with the patient.  Questions have been answered.  All parties agreeable.   Virgel Manifold, MD  11/15/2020, 7:31 AM

## 2020-11-15 NOTE — Anesthesia Preprocedure Evaluation (Signed)
Anesthesia Evaluation  Patient identified by MRN, date of birth, ID band Patient awake    Reviewed: Allergy & Precautions, H&P , NPO status , Patient's Chart, lab work & pertinent test results  Airway Mallampati: III  TM Distance: >3 FB Neck ROM: full    Dental  (+) Chipped   Pulmonary neg pulmonary ROS, neg shortness of breath,    Pulmonary exam normal        Cardiovascular Exercise Tolerance: Good (-) angina(-) Past MI and (-) DOE negative cardio ROS Normal cardiovascular exam     Neuro/Psych negative neurological ROS  negative psych ROS   GI/Hepatic negative GI ROS, Neg liver ROS, neg GERD  ,  Endo/Other  negative endocrine ROS  Renal/GU negative Renal ROS  negative genitourinary   Musculoskeletal   Abdominal   Peds  Hematology negative hematology ROS (+)   Anesthesia Other Findings Past Medical History: No date: Anemia     Comment:  from fibroids No date: Fibroids  Past Surgical History: 08/04/2014: IR GENERIC HISTORICAL     Comment:  IR RADIOLOGIST EVAL & MGMT 08/04/2014 Marybelle Killings, MD               [facility] INTERV RAD 2003: TUBAL LIGATION  BMI    Body Mass Index: 26.58 kg/m      Reproductive/Obstetrics negative OB ROS                             Anesthesia Physical Anesthesia Plan  ASA: II  Anesthesia Plan: General   Post-op Pain Management:    Induction: Intravenous  PONV Risk Score and Plan: Propofol infusion and TIVA  Airway Management Planned: Natural Airway and Nasal Cannula  Additional Equipment:   Intra-op Plan:   Post-operative Plan:   Informed Consent: I have reviewed the patients History and Physical, chart, labs and discussed the procedure including the risks, benefits and alternatives for the proposed anesthesia with the patient or authorized representative who has indicated his/her understanding and acceptance.     Dental Advisory Given  Plan  Discussed with: Anesthesiologist, CRNA and Surgeon  Anesthesia Plan Comments: (Patient consented for risks of anesthesia including but not limited to:  - adverse reactions to medications - risk of airway placement if required - damage to eyes, teeth, lips or other oral mucosa - nerve damage due to positioning  - sore throat or hoarseness - Damage to heart, brain, nerves, lungs, other parts of body or loss of life  Patient voiced understanding.)        Anesthesia Quick Evaluation

## 2020-11-15 NOTE — Anesthesia Postprocedure Evaluation (Signed)
Anesthesia Post Note  Patient: Cynthia Turner  Procedure(s) Performed: COLONOSCOPY WITH PROPOFOL (N/A )  Patient location during evaluation: Endoscopy Anesthesia Type: General Level of consciousness: awake and alert Pain management: pain level controlled Vital Signs Assessment: post-procedure vital signs reviewed and stable Respiratory status: spontaneous breathing, nonlabored ventilation, respiratory function stable and patient connected to nasal cannula oxygen Cardiovascular status: blood pressure returned to baseline and stable Postop Assessment: no apparent nausea or vomiting Anesthetic complications: no   No complications documented.   Last Vitals:  Vitals:   11/15/20 0825 11/15/20 0835  BP: 109/87 112/86  Pulse: 73 77  Resp: (!) 21 18  Temp:    SpO2: 100% 100%    Last Pain:  Vitals:   11/15/20 0835  TempSrc:   PainSc: 0-No pain                 Precious Haws Rishith Siddoway

## 2020-11-15 NOTE — Op Note (Signed)
[facility] Gastroenterology Patient Name: Cynthia Turner Procedure Date: 11/15/2020 7:23 AM MRN: [id number] Account #: [id number] Date of Birth: 02/25/1971 Admit Type: Outpatient Age: 50 Room: [facility] ENDO ROOM 2 Gender: Female Note Status: Finalized Procedure:             Colonoscopy Indications:           Screening for colorectal malignant neoplasm Providers:             Noraa Pickeral B. Bonna Gains MD, MD Referring MD:          Kendall Flack, MD (Referring MD) Medicines:             Monitored Anesthesia Care Complications:         No immediate complications. Procedure:             Pre-Anesthesia Assessment:                        - Prior to the procedure, a History and Physical was                         performed, and patient medications, allergies and                         sensitivities were reviewed. The patient's tolerance                         of previous anesthesia was reviewed.                        - The risks and benefits of the procedure and the                         sedation options and risks were discussed with the                         patient. All questions were answered and informed                         consent was obtained.                        - Patient identification and proposed procedure were                         verified prior to the procedure by the physician, the                         nurse, the anesthetist and the technician. The                         procedure was verified in the pre-procedure area in                         the procedure room in the endoscopy suite.                        - ASA Grade Assessment: II - A patient with mild  systemic disease.                        - After reviewing the risks and benefits, the patient                         was deemed in satisfactory condition to undergo the                         procedure.                        After obtaining informed consent,  the colonoscope was                         passed under direct vision. Throughout the procedure,                         the patient's blood pressure, pulse, and oxygen                         saturations were monitored continuously. The                         Colonoscope was introduced through the anus and                         advanced to the the cecum, identified by appendiceal                         orifice and ileocecal valve. The colonoscopy was                         performed with ease. The patient tolerated the                         procedure well. The quality of the bowel preparation                         was fair. Water and suction used to clean the colon                         with stool intermittently clogging the suction                         channel. 710 ml of material was suctioned to clean the                         colon. Findings:      The perianal and digital rectal examinations were normal.      The rectum, sigmoid colon, descending colon, transverse colon, ascending       colon and cecum appeared normal.      The retroflexed view of the distal rectum and anal verge was normal and       showed no anal or rectal abnormalities. Impression:            - Preparation of the colon was fair.                        -  The rectum, sigmoid colon, descending colon,                         transverse colon, ascending colon and cecum are normal.                        - The distal rectum and anal verge are normal on                         retroflexion view.                        - No specimens collected. Recommendation:        - Discharge patient to home.                        - Resume previous diet.                        - Continue present medications.                        - Repeat colonoscopy in 3 years, with 2 day prep,                         because the bowel preparation was suboptimal.                        - Return to primary care physician as  previously                         scheduled.                        - The findings and recommendations were discussed with                         the patient.                        - The findings and recommendations were discussed with                         the patient's family. Procedure Code(s):     --- Professional ---                        [phone], Colonoscopy, flexible; diagnostic, including                         collection of specimen(s) by brushing or washing, when                         performed (separate procedure) Diagnosis Code(s):     --- Professional ---                        Z12.11, Encounter for screening for malignant neoplasm                         of colon CPT copyright 2019 [employer]. All rights reserved. The codes documented in  this report are preliminary and upon coder review may  be revised to meet current compliance requirements.  Vonda Antigua, MD Margretta Sidle B. Bonna Gains MD, MD 11/15/2020 8:05:16 AM This report has been signed electronically. Number of Addenda: 0 Note Initiated On: 11/15/2020 7:23 AM Scope Withdrawal Time: 0 hours 14 minutes 25 seconds  Total Procedure Duration: 0 hours 22 minutes 48 seconds       [facility]

## 2020-11-16 ENCOUNTER — Encounter: Payer: Self-pay | Admitting: Gastroenterology

## 2022-04-22 IMAGING — CT CT ABD-PELV W/O CM
4 of 5 series · 12 of 46 positions shown, 17 images · non-contrast
Comparison: None.

CLINICAL DATA: Left flank pain

EXAM:
CT ABDOMEN AND PELVIS WITHOUT CONTRAST
TECHNIQUE: Multidetector CT imaging of the abdomen and pelvis was performed
following the standard protocol without IV contrast.

[Series 2: routine abd/pel wo · axial · 0.74mm/px · z∈[-1003,-653]mm · 6 of 99 slices shown, 11 images]
[im 15/99  soft-tissue]
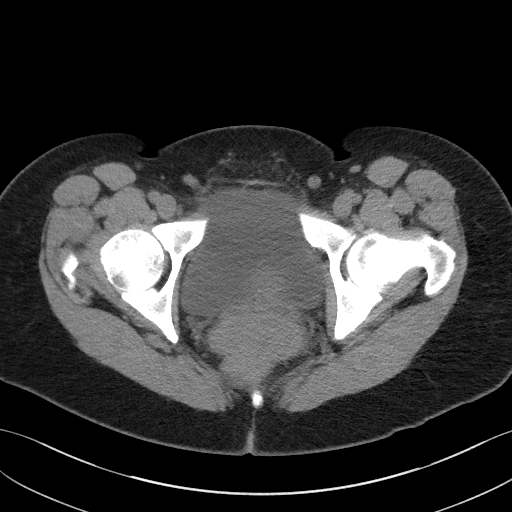
[im 15/99  bone]
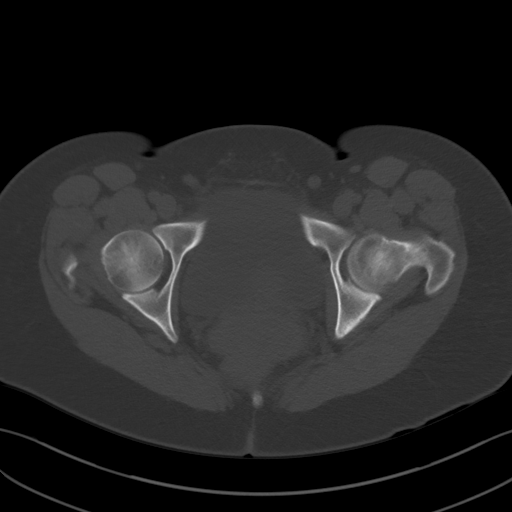
[im 29/99  soft-tissue]
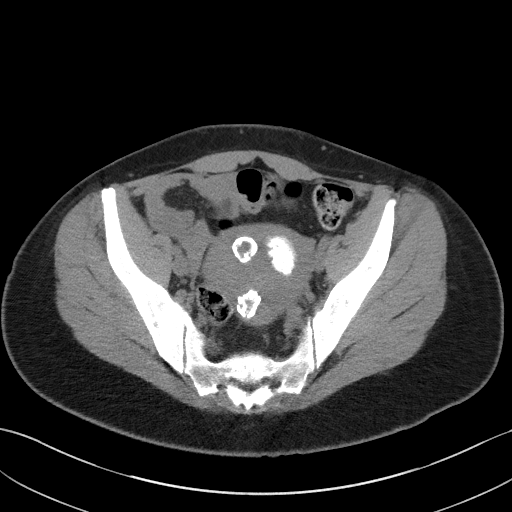
[im 43/99  soft-tissue]
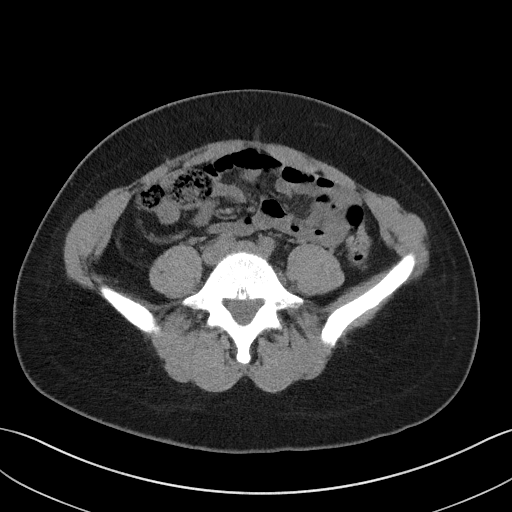
[im 43/99  lung]
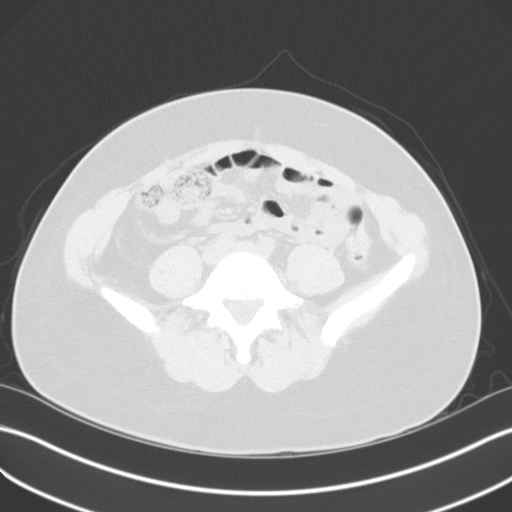
[im 57/99  soft-tissue]
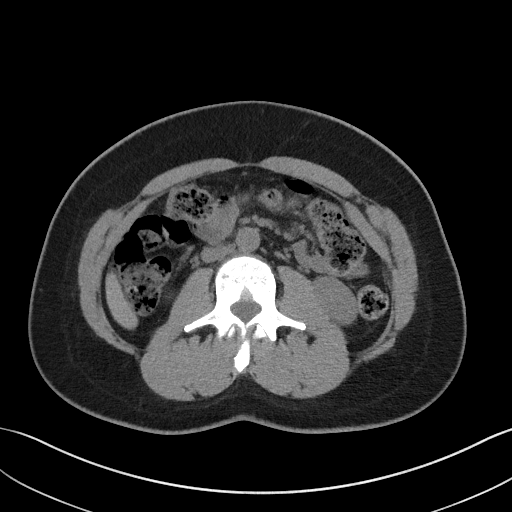
[im 57/99  lung]
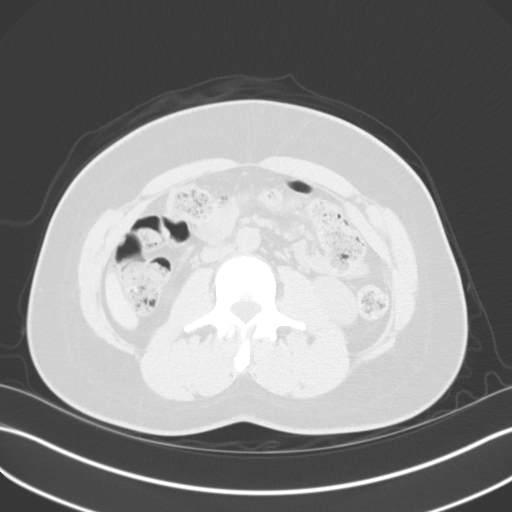
[im 71/99  soft-tissue]
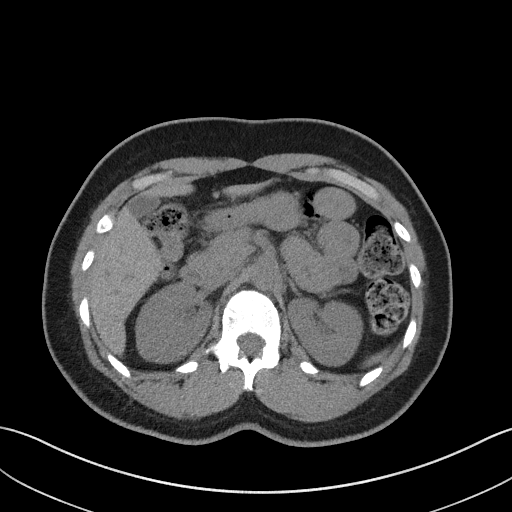
[im 71/99  lung]
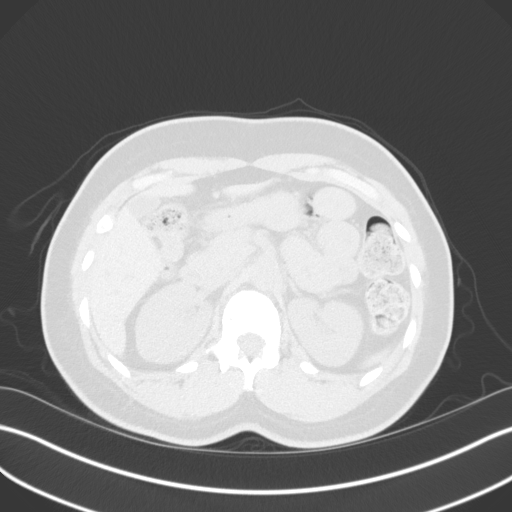
[im 85/99  soft-tissue]
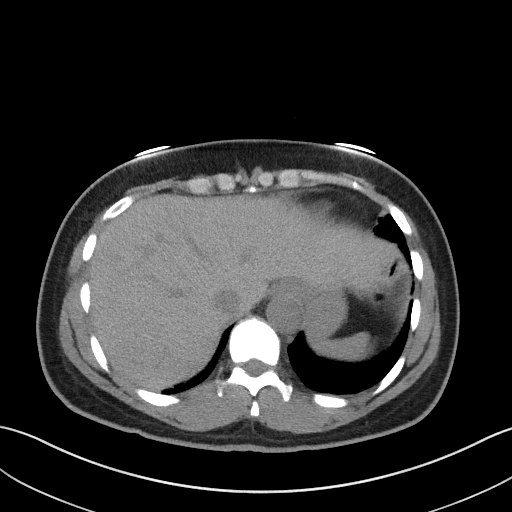
[im 85/99  lung]
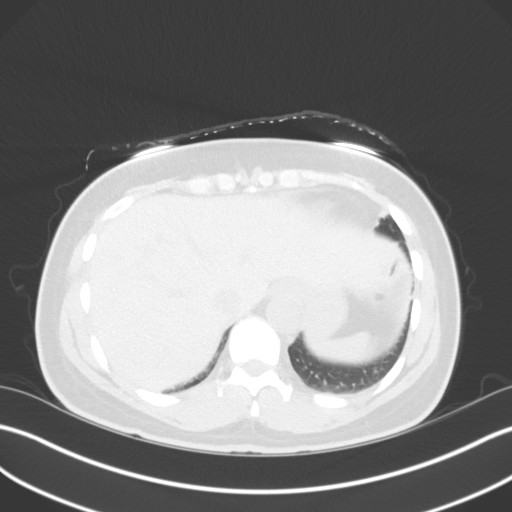

[Series 5: coronal st · coronal · 0.71mm/px · 3 of 81 slices shown]
[im 27/81  soft-tissue]
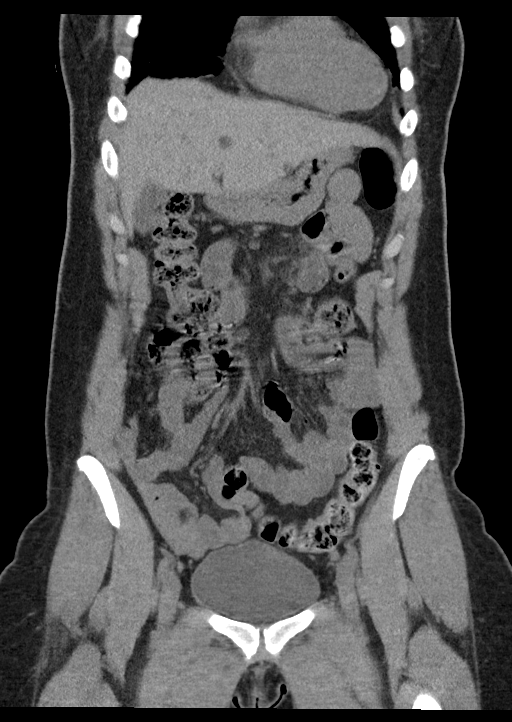
[im 36/81  soft-tissue]
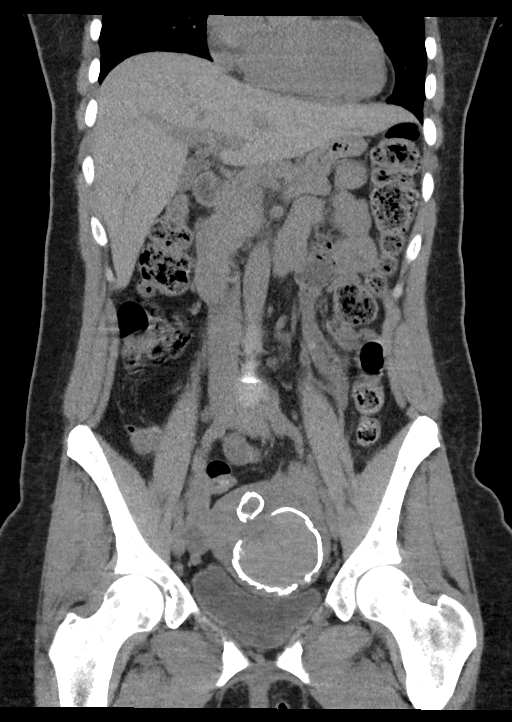
[im 45/81  soft-tissue]
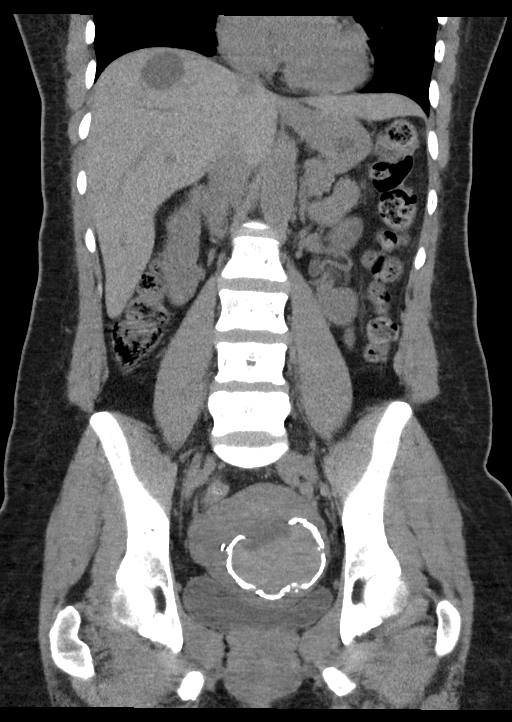

[Series 6: sagittal st · sagittal · 0.58mm/px · 1 of 108 slices shown]
[im 36/108  soft-tissue]
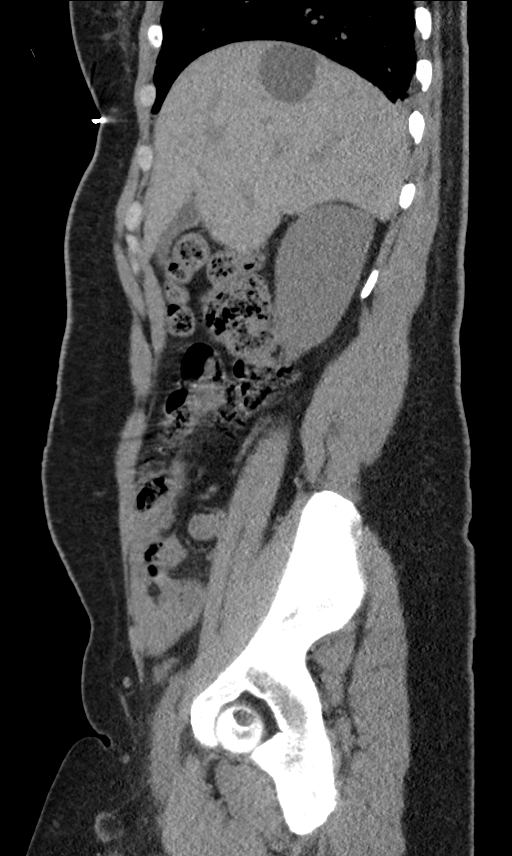

[Series 7: bone pelvis · axial · 0.74mm/px · z∈[-1029,-983]mm · 2 of 247 slices shown]
[im 24/247  bone]
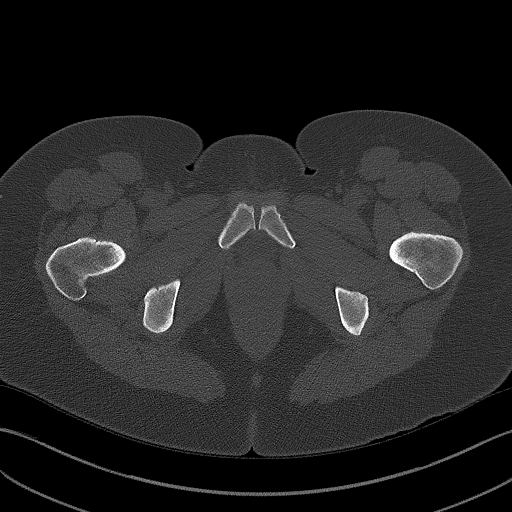
[im 47/247  bone]
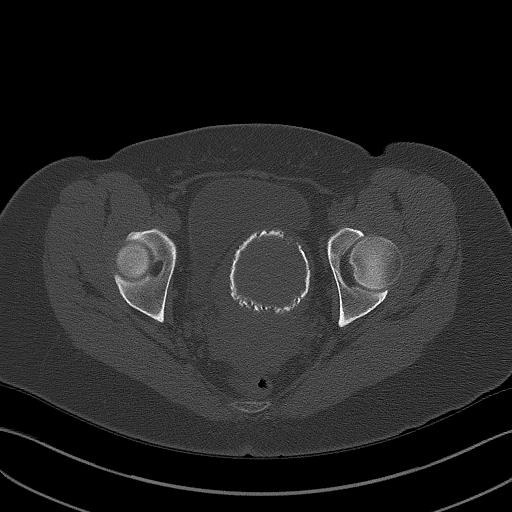

[12 of 46 positions shown; findings below may reference images not displayed]

FINDINGS: LOWER CHEST: Normal.

HEPATOBILIARY: There is a cyst in the right hepatic lobe that
measures 4.3 cm. There is a 1.0 cm cyst in the left hepatic lobe.
The gallbladder is normal.

PANCREAS: Normal pancreas. No ductal dilatation or peripancreatic
fluid collection.

SPLEEN: Normal.

ADRENALS/URINARY TRACT: The adrenal glands are normal. No
hydronephrosis, nephroureterolithiasis or solid renal mass. The
urinary bladder is normal for degree of distention

STOMACH/BOWEL: There is no hiatal hernia. Normal duodenal course and
caliber. No small bowel dilatation or inflammation. No focal colonic
abnormality. Normal appendix.

VASCULAR/LYMPHATIC: Normal course and caliber of the major abdominal
vessels. No abdominal or pelvic lymphadenopathy.

REPRODUCTIVE: There multiple partially calcified uterine fibroids
including a large peripherally calcified fibroid that measures 6.6 x
6.5 cm

MUSCULOSKELETAL. No bony spinal canal stenosis or focal osseous
abnormality.

OTHER: None.
IMPRESSION: 1. No obstructive uropathy or nephrolithiasis.
2. Multiple partially calcified uterine fibroids including a large
peripherally calcified fibroid measuring up to 6.6 cm.

## 2024-06-29 ENCOUNTER — Ambulatory Visit
Admission: EM | Admit: 2024-06-29 | Discharge: 2024-06-29 | Disposition: A | Discharge status: Home / Self Care | Attending: Emergency Medicine | Admitting: Emergency Medicine

## 2024-06-29 ENCOUNTER — Encounter: Payer: Self-pay | Admitting: Emergency Medicine

## 2024-06-29 DIAGNOSIS — B0230 Zoster ocular disease, unspecified: Secondary | ICD-10-CM | POA: Diagnosis not present

## 2024-06-29 MED ORDER — ERYTHROMYCIN 5 MG/GM OP OINT: TOPICAL_OINTMENT | OPHTHALMIC | 0 refills | Status: AC

## 2024-06-29 MED ORDER — IBUPROFEN 600 MG PO TABS: 600.0000 mg | ORAL_TABLET | Freq: Four times a day (QID) | ORAL | 0 refills | Status: AC | PRN

## 2024-06-29 MED ORDER — VALACYCLOVIR HCL 1 G PO TABS: 1000.0000 mg | ORAL_TABLET | Freq: Three times a day (TID) | ORAL | 0 refills | Status: AC

## 2024-06-29 MED ORDER — MUPIROCIN 2 % EX OINT: 1.0000 | TOPICAL_OINTMENT | Freq: Two times a day (BID) | CUTANEOUS | 0 refills | Status: AC

## 2024-06-29 NOTE — ED Provider Notes (Signed)
 HPI  SUBJECTIVE:  Cynthia Turner is a 54 y.o. female who presents with 3 days of right eye irritation, conjunctival injection, increased tearing, blurry vision that clears with blinking.  She reports altered sensation over her right forehead and scalp with a red raised, tender rash over her forehead and along the bridge of her nose starting yesterday.  She reports sinus pain and pressure, right-sided nasal congestion and rhinorrhea.  No purulent drainage, eye pain, pain with EOMs, photophobia, preceding paresthesias over her forehead or scalp, rash elsewhere, headache, ear pain.  No antipyretic in the past 6 hours.  She has tried Tylenol , allergy medications and teas without improvement in her symptoms.  No aggravating or alleviating factors.  She wears glasses, but not contacts.  She has a past medical history of varicella.  No history of shingles, diabetes, hypertension, chronic kidney disease, MRSA, immunocompromise PCP: [facility].    Past Medical History:  Diagnosis Date   Anemia    from fibroids   Fibroids     Past Surgical History:  Procedure Laterality Date   COLONOSCOPY WITH PROPOFOL  N/A 11/15/2020   Procedure: COLONOSCOPY WITH PROPOFOL ;  Surgeon: Janalyn Keene NOVAK, MD;  Location: [facility] ENDOSCOPY;  Service: Endoscopy;  Laterality: N/A;   IR GENERIC HISTORICAL  08/04/2014   IR RADIOLOGIST EVAL & MGMT 08/04/2014 Rome Hall, MD [facility] INTERV RAD   TUBAL LIGATION  2003    Family History  Problem Relation Age of Onset   Healthy Mother    Multiple sclerosis Father     Social History[1]  Current Medications[2]  Allergies[3]   ROS  As noted in HPI.   Physical Exam  BP 110/67 (BP Location: Left Arm)   Pulse 77   Temp 98.4 F (36.9 C) (Oral)   Resp 15   Ht 5' 9 (1.753 m)   Wt 81.6 kg   LMP 06/15/2024 (Approximate)   SpO2 100%   BMI 26.57 kg/m   Constitutional: Well developed, well nourished, no acute distress Eyes:  EOMI, PERRLA.  No direct or  consensual photophobia.  Right conjunctival injection.  No pain with EOMs.  Cornea clear.  No hyphema.  Small amount of fluorescein uptake in the 6:00 position at the periphery of the right cornea.  No dendrites.   Visual Acuity  Right Eye Distance: 20/20 corrected Left Eye Distance: 20/15 corrected Bilateral Distance: 20/15 corrected  Right Eye Near:   Left Eye Near:    Bilateral Near:     HENT: Normocephalic, atraumatic,mucus membranes moist.  Right EAC normal. Respiratory: Normal inspiratory effort Cardiovascular: Normal rate GI: nondistended skin: Tender papular vesicular rash on erythematous base in the right V1 dermatome near her eye and along the nasal bridge.  No rash on the scalp.       Musculoskeletal: no deformities Neurologic: Alert & oriented x 3, no focal neuro deficits Psychiatric: Speech and behavior appropriate   ED Course   Medications - No data to display  Orders Placed This Encounter  Procedures   Visual acuity screening    Standing Status:   Standing    Number of Occurrences:   1   POC SARS Coronavirus Ag    Standing Status:   Standing    Number of Occurrences:   1    No results found for this or any previous visit (from the past 24 hours). No results found.  ED Clinical Impression  1. Herpes zoster with ophthalmic complication, unspecified herpes zoster eye disease  ED Assessment/Plan     Patient presents with shingles in the right V1 dermatome.  I am concerned that it is extending into the eye.  Discussed case with Dr. Georgia, ophthalmologist on-call.  Sending home with Valtrex  1000 mg 3 times daily for 7 days, Bactroban  for when the lesions crust over, Tylenol /ibuprofen , and erythromycin  ointment 3 times daily per ophthalmology's recommendations.  He will see her in the [employer] office tomorrow at 8 AM to make sure that this has not progressed or that there is are any other signs of inflammation.  Work note.  Discussed MDM,  treatment plan, and plan for follow-up with patient. Discussed sn/sx that should prompt return to the ED. patient agrees with plan.   Meds ordered this encounter  Medications   ibuprofen  (ADVIL ) 600 MG tablet    Sig: Take 1 tablet (600 mg total) by mouth every 6 (six) hours as needed.    Dispense:  20 tablet    Refill:  0   valACYclovir  (VALTREX ) 1000 MG tablet    Sig: Take 1 tablet (1,000 mg total) by mouth 3 (three) times daily for 7 days.    Dispense:  21 tablet    Refill:  0   erythromycin  ophthalmic ointment    Sig: 1 cm ribbon to affected eyelid tid x 10 days    Dispense:  5 g    Refill:  0   mupirocin  ointment (BACTROBAN ) 2 %    Sig: Apply 1 Application topically 2 (two) times daily.    Dispense:  22 g    Refill:  0      *This clinic note was created using Scientist, clinical (histocompatibility and immunogenetics). Therefore, there may be occasional mistakes despite careful proofreading.  ?      [1]  Social History Tobacco Use   Smoking status: Never   Smokeless tobacco: Never  Vaping Use   Vaping status: Never Used  Substance Use Topics   Alcohol use: Yes   Drug use: No  [2] No current facility-administered medications for this encounter.  Current Outpatient Medications:    erythromycin  ophthalmic ointment, 1 cm ribbon to affected eyelid tid x 10 days, Disp: 5 g, Rfl: 0   ibuprofen  (ADVIL ) 600 MG tablet, Take 1 tablet (600 mg total) by mouth every 6 (six) hours as needed., Disp: 20 tablet, Rfl: 0   mupirocin  ointment (BACTROBAN ) 2 %, Apply 1 Application topically 2 (two) times daily., Disp: 22 g, Rfl: 0   valACYclovir  (VALTREX ) 1000 MG tablet, Take 1 tablet (1,000 mg total) by mouth 3 (three) times daily for 7 days., Disp: 21 tablet, Rfl: 0   docusate sodium  100 MG CAPS, Take 100 mg by mouth 2 (two) times daily., Disp: 20 capsule, Rfl: 0   Multiple Vitamin (MULTIVITAMIN WITH MINERALS) TABS tablet, Take 1 tablet by mouth daily., Disp: , Rfl:    polyethylene glycol-electrolytes (GAVILYTE-N WITH  FLAVOR PACK) 420 g solution, Drink one 8 oz glass every 20 mins until entire container is finished starting at 5:00pm on 11/14/20, Disp: 4000 mL, Rfl: 0 [3] No Known Allergies    Van Knee, MD 06/30/24 [phone]

## 2024-06-29 NOTE — Discharge Instructions (Addendum)
 Follow-up with Dr. Fiacco at [facility] ([place] on [address] tomorrow at 8 AM.  They will work you in.  Tell him that the urgent care provider directly spoke with him and that he wants you to be seen tomorrow.  Take the Valtrex  as directed.  Erythromycin  ointment 3 times a day.  600 mg of ibuprofen  combined with 1000 mg of Tylenol  together 3-4 times a day as needed for pain.  Refrigerated Systane as needed for eye discomfort.  Bactroban  for the lesions rupture and start to crust over.

## 2024-06-29 NOTE — ED Triage Notes (Signed)
 Patient reports right eye redness, drainage and discomfort that started 3 days ago.  Patient reports a painful rash between her eyebrows that started 2 days ago.  Patient reports some sinus pressure and headaches.  Patient unsure of fevers.
